# Patient Record
Sex: Male | Born: 1985
Health system: Southern US, Community
[De-identification: ages and names within clinical notes are randomized; demographics above are authoritative.]

## PROBLEM LIST (undated history)

## (undated) DIAGNOSIS — Z789 Other specified health status: Secondary | ICD-10-CM

---

## 2004-01-15 ENCOUNTER — Emergency Department (HOSPITAL_COMMUNITY): Admission: EM | Admit: 2004-01-15 | Discharge: 2004-01-15 | Payer: Self-pay | Admitting: Emergency Medicine

## 2015-06-14 ENCOUNTER — Emergency Department (HOSPITAL_COMMUNITY)
Admission: EM | Admit: 2015-06-14 | Discharge: 2015-06-14 | Disposition: A | Discharge status: Home / Self Care | Payer: Self-pay | Attending: Emergency Medicine | Admitting: Emergency Medicine

## 2015-06-14 ENCOUNTER — Encounter (HOSPITAL_COMMUNITY): Payer: Self-pay

## 2015-06-14 DIAGNOSIS — A64 Unspecified sexually transmitted disease: Secondary | ICD-10-CM | POA: Insufficient documentation

## 2015-06-14 MED ORDER — AZITHROMYCIN 250 MG PO TABS
1000.0000 mg | ORAL_TABLET | Freq: Once | ORAL | Status: AC
  Administered 2015-06-14: 1000 mg via ORAL
  Filled 2015-06-14: qty 4

## 2015-06-14 MED ORDER — ACYCLOVIR 400 MG PO TABS: 400.0000 mg | ORAL_TABLET | Freq: Three times a day (TID) | ORAL | Status: DC

## 2015-06-14 MED ORDER — CEFTRIAXONE SODIUM 250 MG IJ SOLR
250.0000 mg | Freq: Once | INTRAMUSCULAR | Status: AC
  Administered 2015-06-14: 250 mg via INTRAMUSCULAR
  Filled 2015-06-14: qty 250

## 2015-06-14 NOTE — ED Notes (Addendum)
Pt c/o rash to penis x 1 week.  Had unprotected oral sex a few weeks ago.  C/O itching.  Denies d/c. Denies pain. States he also feels like he's got a cold.

## 2015-06-14 NOTE — Discharge Instructions (Signed)
He was seen and evaluated in the ED today. You were treated empirically for herpes as well as gonorrhea and chlamydia. You'll receive a call in 3 days if your tests are positive. Please follow-up with health department for further evaluation and management of your symptoms.  Sexually Transmitted Disease A sexually transmitted disease (STD) is a disease or infection often passed to another person during sex. However, STDs can be passed through nonsexual ways. An STD can be passed through:  Spit (saliva).  Semen.  Blood.  Mucus from the vagina.  Pee (urine). HOW CAN I LESSEN MY CHANCES OF GETTING AN STD?  Use:  Latex condoms.  Water-soluble lubricants with condoms. Do not use petroleum jelly or oils.  Dental dams. These are small pieces of latex that are used as a barrier during oral sex.  Avoid having more than one sex partner.  Do not have sex with someone who has other sex partners.  Do not have sex with anyone you do not know or who is at high risk for an STD.  Avoid risky sex that can break your skin.  Do not have sex if you have open sores on your mouth or skin.  Avoid drinking too much alcohol or taking illegal drugs. Alcohol and drugs can affect your good judgment.  Avoid oral and anal sex acts.  Get shots (vaccines) for HPV and hepatitis.  If you are at risk of being infected with HIV, it is advised that you take a certain medicine daily to prevent HIV infection. This is called pre-exposure prophylaxis (PrEP). You may be at risk if:  You are a man who has sex with other men (MSM).  You are attracted to the opposite sex (heterosexual) and are having sex with more than one partner.  You take drugs with a needle.  You have sex with someone who has HIV.  Talk with your doctor about if you are at high risk of being infected with HIV. If you begin to take PrEP, get tested for HIV first. Get tested every 3 months for as long as you are taking PrEP. WHAT SHOULD I DO  IF I THINK I HAVE AN STD?  See your doctor.  Tell your sex partner(s) that you have an STD. They should be tested and treated.  Do not have sex until your doctor says it is okay. WHEN SHOULD I GET HELP? Get help right away if:  You have bad belly (abdominal) pain.  You are a man and have puffiness (swelling) or pain in your testicles.  You are a woman and have puffiness in your vagina. Document Released: 12/30/2004 Document Revised: 11/27/2013 Document Reviewed: 05/18/2013 [facility] Patient Information 2015 [place]. This information is not intended to replace advice given to you by your health care provider. Make sure you discuss any questions you have with your health care provider.

## 2015-06-14 NOTE — ED Provider Notes (Signed)
CSN: [id number]     Arrival date & time 06/14/15  0902 History   First MD Initiated Contact with Patient 06/14/15 [phone]     Chief Complaint  Patient presents with  . Rash     (Consider location/radiation/quality/duration/timing/severity/associated sxs/prior Treatment) HPI Gary Graham is a 29 y.o. male who comes in for evaluation of penile rash. Patient states 3 weeks ago he had unprotected oral sex and has noticed a rash at the end of his penis for the last week. Reports associated itching. He denies any fevers, penile discharge, urinary symptoms, scrotal pain or swelling, abdominal pain, nausea or vomiting. No interventions tried. No other aggravating or modifying factors. Patient has no other medical complaints.  History reviewed. No pertinent past medical history. History reviewed. No pertinent past surgical history. No family history on file. History  Substance Use Topics  . Smoking status: Never Smoker   . Smokeless tobacco: Not on file  . Alcohol Use: Yes     Comment: states he drinks most days a week    Review of Systems A 10 point review of systems was completed and was negative except for pertinent positives and negatives as mentioned in the history of present illness     Allergies  Review of patient's allergies indicates no known allergies.  Home Medications   Prior to Admission medications   Medication Sig Start Date End Date Taking? Authorizing Provider  acyclovir (ZOVIRAX) 400 MG tablet Take 1 tablet (400 mg total) by mouth 3 (three) times daily. 06/14/15   Joycie Peek, PA-C   BP 136/84 mmHg  Pulse 101  Temp(Src) 98.8 F (37.1 C) (Oral)  Resp 18  Ht  (1.753 m)  Wt 205 lb (92.987 kg)  BMI 30.26 kg/m2  SpO2 98% Physical Exam  Constitutional:  Awake, alert, nontoxic appearance.  HENT:  Head: Atraumatic.  Eyes: Right eye exhibits no discharge. Left eye exhibits no discharge.  Neck: Neck supple.  Pulmonary/Chest: Effort normal. He exhibits no  tenderness.  Abdominal: Soft. There is no tenderness. There is no rebound.  Genitourinary:  Chaperone present for entirety of genital exam. Nonpainful, crusting lesions noted to shaft of penis. Small vesicle on an erythematous base noted to the glans. No discharge noted See pictures attached.  Musculoskeletal: He exhibits no tenderness.  Baseline ROM, no obvious new focal weakness.  Neurological:  Mental status and motor strength appears baseline for patient and situation.  Skin: No rash noted.  Psychiatric: He has a normal mood and affect.  Nursing note and vitals reviewed.       ED Course  Procedures (including critical care time) Labs Review Labs Reviewed  HERPES SIMPLEX VIRUS CULTURE  HIV ANTIBODY (ROUTINE TESTING)  RPR  HSV 1 ANTIBODY, IGG  HSV 2 ANTIBODY, IGG  GC/CHLAMYDIA PROBE AMP ([place]) NOT AT [facility]    Imaging Review No results found.   EKG Interpretation None     Meds given in ED:  Medications  cefTRIAXone (ROCEPHIN) injection 250 mg (not administered)  azithromycin (ZITHROMAX) tablet 1,000 mg (not administered)    New Prescriptions   ACYCLOVIR (ZOVIRAX) 400 MG TABLET    Take 1 tablet (400 mg total) by mouth 3 (three) times daily.    Filed Vitals:   06/14/15 0910  BP: 136/84  Pulse: 101  Temp: 98.8 F (37.1 C)  TempSrc: Oral  Resp: 18  Height:  (1.753 m)  Weight: 205 lb (92.987 kg)  SpO2: 98%    MDM  Vitals stable -  WNL -afebrile Pt resting comfortably in ED. PE--physical exam as mentioned above. Labwork--test for HSV 1, 2, syphilis, GC chlamydia  DDX--will treat empirically with acyclovir, Rocephin and azithromycin in the ED. Encouraged safe sex practices, follow-up with health Department. I discussed all relevant lab findings and imaging results with pt and they verbalized understanding. Discussed f/u with PCP within 48 hrs and return precautions, pt very amenable to plan.  Final diagnoses:  STI (sexually transmitted  infection)       Joycie PeekBenjamin Nike Southwell, PA-C 06/14/15 [phone]  Mirian MoMatthew Gentry, MD 06/16/15 [phone]

## 2015-06-15 LAB — RPR: RPR Ser Ql: NONREACTIVE

## 2015-06-15 LAB — HIV ANTIBODY (ROUTINE TESTING W REFLEX): HIV Screen 4th Generation wRfx: NONREACTIVE

## 2015-06-16 LAB — GC/CHLAMYDIA PROBE AMP (~~LOC~~) NOT AT ARMC
CHLAMYDIA, DNA PROBE: NEGATIVE
Neisseria Gonorrhea: NEGATIVE

## 2015-06-18 LAB — HERPES SIMPLEX VIRUS CULTURE: Culture: DETECTED

## 2015-06-20 ENCOUNTER — Telehealth (HOSPITAL_COMMUNITY): Payer: Self-pay

## 2015-06-20 NOTE — ED Notes (Signed)
Positive for Herpes simplex type 1.  Treated per protocol. Attempting to notify

## 2016-06-19 ENCOUNTER — Emergency Department (HOSPITAL_COMMUNITY): Payer: Self-pay

## 2016-06-19 ENCOUNTER — Encounter (HOSPITAL_COMMUNITY): Payer: Self-pay | Admitting: *Deleted

## 2016-06-19 ENCOUNTER — Emergency Department (HOSPITAL_COMMUNITY)
Admission: EM | Admit: 2016-06-19 | Discharge: 2016-06-19 | Disposition: A | Discharge status: Home / Self Care | Payer: Self-pay | Attending: Emergency Medicine | Admitting: Emergency Medicine

## 2016-06-19 DIAGNOSIS — J189 Pneumonia, unspecified organism: Secondary | ICD-10-CM | POA: Insufficient documentation

## 2016-06-19 LAB — CBC
HEMATOCRIT: 44.5 % (ref 39.0–52.0)
HEMOGLOBIN: 15.1 g/dL (ref 13.0–17.0)
MCH: 30.6 pg (ref 26.0–34.0)
MCHC: 33.9 g/dL (ref 30.0–36.0)
MCV: 90.1 fL (ref 78.0–100.0)
Platelets: 342 10*3/uL (ref 150–400)
RBC: 4.94 MIL/uL (ref 4.22–5.81)
RDW: 12.6 % (ref 11.5–15.5)
WBC: 10.2 10*3/uL (ref 4.0–10.5)

## 2016-06-19 LAB — URINALYSIS, ROUTINE W REFLEX MICROSCOPIC
Bilirubin Urine: NEGATIVE
Glucose, UA: NEGATIVE mg/dL
Ketones, ur: NEGATIVE mg/dL
Leukocytes, UA: NEGATIVE
NITRITE: NEGATIVE
Protein, ur: NEGATIVE mg/dL
SPECIFIC GRAVITY, URINE: 1.007 (ref 1.005–1.030)
pH: 6 (ref 5.0–8.0)

## 2016-06-19 LAB — COMPREHENSIVE METABOLIC PANEL
ALK PHOS: 69 U/L (ref 38–126)
ALT: 27 U/L (ref 17–63)
ANION GAP: 10 (ref 5–15)
AST: 21 U/L (ref 15–41)
Albumin: 3.6 g/dL (ref 3.5–5.0)
BUN: 7 mg/dL (ref 6–20)
CO2: 23 mmol/L (ref 22–32)
Calcium: 9.1 mg/dL (ref 8.9–10.3)
Chloride: 104 mmol/L (ref 101–111)
Creatinine, Ser: 0.94 mg/dL (ref 0.61–1.24)
GFR calc non Af Amer: >60 mL/min (ref >60)
Glucose, Bld: 99 mg/dL (ref 65–99)
POTASSIUM: 3.3 mmol/L — AB (ref 3.5–5.1)
SODIUM: 137 mmol/L (ref 135–145)
TOTAL PROTEIN: 7 g/dL (ref 6.5–8.1)
Total Bilirubin: 0.6 mg/dL (ref 0.3–1.2)

## 2016-06-19 LAB — URINE MICROSCOPIC-ADD ON

## 2016-06-19 LAB — LIPASE, BLOOD: Lipase: 17 U/L (ref 11–51)

## 2016-06-19 MED ORDER — AZITHROMYCIN 250 MG PO TABS: 250.0000 mg | ORAL_TABLET | Freq: Every day | ORAL | Status: DC

## 2016-06-19 MED ORDER — TRAMADOL HCL 50 MG PO TABS: 50.0000 mg | ORAL_TABLET | Freq: Four times a day (QID) | ORAL | Status: DC | PRN

## 2016-06-19 MED ORDER — OXYCODONE-ACETAMINOPHEN 5-325 MG PO TABS
1.0000 | ORAL_TABLET | Freq: Once | ORAL | Status: AC
  Administered 2016-06-19: 1 via ORAL
  Filled 2016-06-19: qty 1

## 2016-06-19 NOTE — Discharge Instructions (Signed)

## 2016-06-19 NOTE — ED Notes (Signed)
The pt is c/o rt abd pain since yesterday  No n v or d

## 2016-06-19 NOTE — ED Notes (Signed)
Pt taken to xray 

## 2016-06-19 NOTE — ED Notes (Signed)
Pt returned from xray

## 2016-06-19 NOTE — ED Notes (Signed)
Pt transporting to US.

## 2016-06-19 NOTE — ED Provider Notes (Signed)
CSN: [id number]     Arrival date & time 06/19/16  0258 History  By signing my name below, I, Gary Graham, attest that this documentation has been prepared under the direction and in the presence of Gary Crease, MD. Electronically Signed: Angelene Graham, ED Scribe. 06/19/2016. 3:25 AM.    Chief Complaint  Patient presents with  . Abdominal Pain   Patient is a 30 y.o. male presenting with abdominal pain. The history is provided by the patient. No language interpreter was used.  Abdominal Pain Pain location:  RUQ Pain quality: pressure   Pain radiates to:  Back Pain severity:  Moderate Onset quality:  Sudden Timing:  Intermittent Progression:  Worsening Chronicity:  New Relieved by:  None tried Worsened by:  Nothing tried Ineffective treatments:  None tried Associated symptoms: no chills, no cough, no diarrhea, no nausea and no vomiting    HPI Comments: Gary Graham is a 30 y.o. male who presents to the Emergency Department complaining of sudden onset of gradually worsening intermittent pressure RUQ pain that radiates toward his right back onset yesterday evening. He notes that the pain is worse in his back. No alleviating factors noted. Pt has not tried any medications PTA. No sick contacts noted. He denies a hx of similar symptoms or any abdominal surgeries. He also denies any chills, n/v/d or cough.    History reviewed. No pertinent past medical history. History reviewed. No pertinent past surgical history. No family history on file. Social History  Substance Use Topics  . Smoking status: Never Smoker   . Smokeless tobacco: None  . Alcohol Use: Yes     Comment: states he drinks most days a week    Review of Systems  Constitutional: Negative for chills.  Respiratory: Negative for cough.   Gastrointestinal: Positive for abdominal pain. Negative for nausea, vomiting and diarrhea.  Musculoskeletal: Positive for back pain.  All other systems reviewed and are  negative.     Allergies  Review of patient's allergies indicates no known allergies.  Home Medications   Prior to Admission medications   Medication Sig Start Date End Date Taking? Authorizing Provider  acyclovir (ZOVIRAX) 400 MG tablet Take 1 tablet (400 mg total) by mouth 3 (three) times daily. Patient not taking: Reported on 06/19/2016 06/14/15   Gary Peek, PA-C  azithromycin (ZITHROMAX) 250 MG tablet Take 1 tablet (250 mg total) by mouth daily. Take first 2 tablets together, then 1 every day until finished. 06/19/16   Gary Crease, MD  traMADol (ULTRAM) 50 MG tablet Take 1 tablet (50 mg total) by mouth every 6 (six) hours as needed. 06/19/16   Gary Crease, MD   BP 111/67 mmHg  Pulse 88  Temp(Src) 99.5 F (37.5 C) (Oral)  Resp 18  SpO2 94% Physical Exam  Constitutional: He is oriented to person, place, and time. He appears well-developed and well-nourished. No distress.  HENT:  Head: Normocephalic and atraumatic.  Right Ear: Hearing normal.  Left Ear: Hearing normal.  Nose: Nose normal.  Mouth/Throat: Oropharynx is clear and moist and mucous membranes are normal.  Eyes: Conjunctivae and EOM are normal. Pupils are equal, round, and reactive to light.  Neck: Normal range of motion. Neck supple.  Cardiovascular: Regular rhythm, S1 normal and S2 normal.  Exam reveals no gallop and no friction rub.   No murmur heard. Pulmonary/Chest: Effort normal and breath sounds normal. No respiratory distress. He exhibits no tenderness.  Abdominal: Soft. Normal appearance and bowel sounds are  normal. There is no hepatosplenomegaly. There is tenderness. There is no rebound, no guarding, no tenderness at McBurney's point and negative Murphy's sign. No hernia.  RUQ tenderness, no murphy's sign  Musculoskeletal: Normal range of motion.  Neurological: He is alert and oriented to person, place, and time. He has normal strength. No cranial nerve deficit or sensory deficit.  Coordination normal. GCS eye subscore is 4. GCS verbal subscore is 5. GCS motor subscore is 6.  Skin: Skin is warm, dry and intact. No rash noted. No cyanosis.  Psychiatric: He has a normal mood and affect. His speech is normal and behavior is normal. Thought content normal.  Nursing note and vitals reviewed.   ED Course  Procedures (including critical care time) DIAGNOSTIC STUDIES: Oxygen Saturation is 95% on RA, normal by my interpretation.    COORDINATION OF CARE: 3:22 AM- Pt advised of plan for treatment and pt agrees. Pt will receive lab work for further evaluation.    Labs Review Labs Reviewed  COMPREHENSIVE METABOLIC PANEL - Abnormal; Notable for the following:    Potassium 3.3 (*)    All other components within normal limits  URINALYSIS, ROUTINE W REFLEX MICROSCOPIC (NOT AT [facility]) - Abnormal; Notable for the following:    Hgb urine dipstick SMALL (*)    All other components within normal limits  URINE MICROSCOPIC-ADD ON - Abnormal; Notable for the following:    Squamous Epithelial / LPF 0-5 (*)    Bacteria, UA RARE (*)    All other components within normal limits  LIPASE, BLOOD  CBC    Imaging Review Dg Chest 2 View  06/19/2016  CLINICAL DATA:  Right flank pain for 3 days. EXAM: CHEST  2 VIEW COMPARISON:  None. FINDINGS: Shallow inspiration. Increased opacity in the right lung base could represent atelectasis or pneumonia. No blunting of costophrenic angles. No pneumothorax. Normal heart size and pulmonary vascularity. Mediastinal contours appear intact. IMPRESSION: Shallow inspiration. Right lung base opacity could represent atelectasis or pneumonia. Electronically Signed   By: Gary Graham M.D.   On: 06/19/2016 06:02   US Abdomen Limited Ruq  06/19/2016  CLINICAL DATA:  Abdominal pain for 1 day. EXAM: US ABDOMEN LIMITED - RIGHT UPPER QUADRANT COMPARISON:  None. FINDINGS: Gallbladder: No gallstones or wall thickening visualized. No sonographic Murphy sign noted by  sonographer. Common bile duct: Diameter: 3 mm, normal Liver: Mildly increased parenchymal echotexture suggesting diffuse fatty infiltration. Focal sparing along the gallbladder fossa. IMPRESSION: Diffuse fatty infiltration of the liver. No evidence of cholelithiasis or cholecystitis. Electronically Signed   By: Gary Graham M.D.   On: 06/19/2016 04:16     Gary Crease, MD has personally reviewed and evaluated these images and lab results as part of his medical decision-making.   EKG Interpretation None      MDM   Final diagnoses:  CAP (community acquired pneumonia)    Patient presents to the ER for evaluation of abdominal pain. Patient experiencing pain around his right flank area. He has not had nausea, vomiting or diarrhea associated with symptoms. Pain is in the right posterior upper back area as well as right upper abdomen. He did have tenderness in the right upper quadrant, but no obvious Murphy sign. Lab work has been unremarkable. Vital signs are unremarkable. Gallbladder ultrasound negative, does have fatty liver which does not explain his current symptoms. Chest x-ray does show opacity at the right base. Will treat for community-acquired pneumonia.  I personally performed the services described in this documentation,  which was scribed in my presence. The recorded information has been reviewed and is accurate.    Gary Creasehristopher J Jamaris Biernat, MD 06/19/16 [phone]

## 2019-10-30 ENCOUNTER — Encounter (HOSPITAL_COMMUNITY): Payer: Self-pay | Admitting: Emergency Medicine

## 2019-10-30 ENCOUNTER — Emergency Department (HOSPITAL_COMMUNITY)
Admission: EM | Admit: 2019-10-30 | Discharge: 2019-10-31 | Discharge status: Against Medical Advice | Payer: Self-pay | Attending: Emergency Medicine | Admitting: Emergency Medicine

## 2019-10-30 ENCOUNTER — Other Ambulatory Visit: Payer: Self-pay

## 2019-10-30 ENCOUNTER — Emergency Department (HOSPITAL_COMMUNITY): Payer: Self-pay

## 2019-10-30 DIAGNOSIS — Z5321 Procedure and treatment not carried out due to patient leaving prior to being seen by health care provider: Secondary | ICD-10-CM | POA: Insufficient documentation

## 2019-10-30 LAB — BASIC METABOLIC PANEL
Anion gap: 15 (ref 5–15)
BUN: 12 mg/dL (ref 6–20)
CO2: 17 mmol/L — ABNORMAL LOW (ref 22–32)
Calcium: 8.8 mg/dL — ABNORMAL LOW (ref 8.9–10.3)
Chloride: 105 mmol/L (ref 98–111)
Creatinine, Ser: 1.13 mg/dL (ref 0.61–1.24)
GFR calc Af Amer: >60 mL/min (ref >60)
GFR calc non Af Amer: >60 mL/min (ref >60)
Glucose, Bld: 143 mg/dL — ABNORMAL HIGH (ref 70–99)
Potassium: 3 mmol/L — ABNORMAL LOW (ref 3.5–5.1)
Sodium: 137 mmol/L (ref 135–145)

## 2019-10-30 LAB — CBC
HCT: 45.2 % (ref 39.0–52.0)
Hemoglobin: 15.5 g/dL (ref 13.0–17.0)
MCH: 32 pg (ref 26.0–34.0)
MCHC: 34.3 g/dL (ref 30.0–36.0)
MCV: 93.2 fL (ref 80.0–100.0)
Platelets: 382 10*3/uL (ref 150–400)
RBC: 4.85 MIL/uL (ref 4.22–5.81)
RDW: 12.4 % (ref 11.5–15.5)
WBC: 9.8 10*3/uL (ref 4.0–10.5)
nRBC: 0 % (ref 0.0–0.2)

## 2019-10-30 LAB — TROPONIN I (HIGH SENSITIVITY): Troponin I (High Sensitivity): 15 ng/L (ref <18)

## 2019-10-30 MED ORDER — SODIUM CHLORIDE 0.9% FLUSH: 3.0000 mL | Freq: Once | INTRAVENOUS | Status: DC

## 2019-10-30 MED ORDER — ONDANSETRON 4 MG PO TBDP
4.0000 mg | ORAL_TABLET | Freq: Once | ORAL | Status: AC
  Administered 2019-10-30: 4 mg via ORAL
  Filled 2019-10-30: qty 1

## 2019-10-30 NOTE — ED Triage Notes (Signed)
Patient reports central chest pain with emesis this evening , no SOB , denies diaphoresis . Occasional productive cough .

## 2019-10-31 NOTE — ED Notes (Signed)
Pt did not respond when called for vitals check 

## 2019-11-03 ENCOUNTER — Observation Stay (HOSPITAL_BASED_OUTPATIENT_CLINIC_OR_DEPARTMENT_OTHER): Payer: Self-pay

## 2019-11-03 ENCOUNTER — Other Ambulatory Visit: Payer: Self-pay

## 2019-11-03 ENCOUNTER — Inpatient Hospital Stay (HOSPITAL_COMMUNITY)
Admission: EM | Admit: 2019-11-03 | Discharge: 2019-11-06 | DRG: 247 | Disposition: A | Discharge status: Home / Self Care | Payer: Self-pay | Attending: Internal Medicine | Admitting: Internal Medicine

## 2019-11-03 ENCOUNTER — Encounter (HOSPITAL_COMMUNITY): Payer: Self-pay | Admitting: Family Medicine

## 2019-11-03 ENCOUNTER — Emergency Department (HOSPITAL_COMMUNITY): Payer: Self-pay

## 2019-11-03 DIAGNOSIS — R0789 Other chest pain: Secondary | ICD-10-CM

## 2019-11-03 DIAGNOSIS — F17200 Nicotine dependence, unspecified, uncomplicated: Secondary | ICD-10-CM | POA: Diagnosis present

## 2019-11-03 DIAGNOSIS — D72829 Elevated white blood cell count, unspecified: Secondary | ICD-10-CM | POA: Diagnosis present

## 2019-11-03 DIAGNOSIS — I2510 Atherosclerotic heart disease of native coronary artery without angina pectoris: Secondary | ICD-10-CM | POA: Diagnosis present

## 2019-11-03 DIAGNOSIS — R079 Chest pain, unspecified: Secondary | ICD-10-CM

## 2019-11-03 DIAGNOSIS — Z20828 Contact with and (suspected) exposure to other viral communicable diseases: Secondary | ICD-10-CM | POA: Diagnosis present

## 2019-11-03 DIAGNOSIS — Z833 Family history of diabetes mellitus: Secondary | ICD-10-CM

## 2019-11-03 DIAGNOSIS — E785 Hyperlipidemia, unspecified: Secondary | ICD-10-CM | POA: Diagnosis present

## 2019-11-03 DIAGNOSIS — R778 Other specified abnormalities of plasma proteins: Secondary | ICD-10-CM

## 2019-11-03 DIAGNOSIS — R7989 Other specified abnormal findings of blood chemistry: Secondary | ICD-10-CM

## 2019-11-03 DIAGNOSIS — I214 Non-ST elevation (NSTEMI) myocardial infarction: Principal | ICD-10-CM | POA: Diagnosis present

## 2019-11-03 DIAGNOSIS — Z720 Tobacco use: Secondary | ICD-10-CM | POA: Diagnosis present

## 2019-11-03 DIAGNOSIS — Z955 Presence of coronary angioplasty implant and graft: Secondary | ICD-10-CM

## 2019-11-03 DIAGNOSIS — Z8249 Family history of ischemic heart disease and other diseases of the circulatory system: Secondary | ICD-10-CM

## 2019-11-03 HISTORY — DX: Other specified health status: Z78.9

## 2019-11-03 LAB — RAPID URINE DRUG SCREEN, HOSP PERFORMED
Amphetamines: NOT DETECTED
Barbiturates: NOT DETECTED
Benzodiazepines: NOT DETECTED
Cocaine: NOT DETECTED
Opiates: POSITIVE — AB
Tetrahydrocannabinol: NOT DETECTED

## 2019-11-03 LAB — CBC
HCT: 48 % (ref 39.0–52.0)
Hemoglobin: 16.4 g/dL (ref 13.0–17.0)
MCH: 31.8 pg (ref 26.0–34.0)
MCHC: 34.2 g/dL (ref 30.0–36.0)
MCV: 93 fL (ref 80.0–100.0)
Platelets: 360 10*3/uL (ref 150–400)
RBC: 5.16 MIL/uL (ref 4.22–5.81)
RDW: 11.9 % (ref 11.5–15.5)
WBC: 10.8 10*3/uL — ABNORMAL HIGH (ref 4.0–10.5)
nRBC: 0 % (ref 0.0–0.2)

## 2019-11-03 LAB — BASIC METABOLIC PANEL
Anion gap: 14 (ref 5–15)
BUN: 10 mg/dL (ref 6–20)
CO2: 25 mmol/L (ref 22–32)
Calcium: 9.9 mg/dL (ref 8.9–10.3)
Chloride: 98 mmol/L (ref 98–111)
Creatinine, Ser: 1.12 mg/dL (ref 0.61–1.24)
GFR calc Af Amer: >60 mL/min (ref >60)
GFR calc non Af Amer: >60 mL/min (ref >60)
Glucose, Bld: 109 mg/dL — ABNORMAL HIGH (ref 70–99)
Potassium: 3.9 mmol/L (ref 3.5–5.1)
Sodium: 137 mmol/L (ref 135–145)

## 2019-11-03 LAB — ECHOCARDIOGRAM COMPLETE
Height: 69 in
Weight: 3488 oz

## 2019-11-03 LAB — SARS CORONAVIRUS 2 BY RT PCR (HOSPITAL ORDER, PERFORMED IN ~~LOC~~ HOSPITAL LAB): SARS Coronavirus 2: NEGATIVE

## 2019-11-03 LAB — C-REACTIVE PROTEIN: CRP: 7.7 mg/dL — ABNORMAL HIGH (ref <1.0)

## 2019-11-03 LAB — SEDIMENTATION RATE: Sed Rate: 22 mm/hr — ABNORMAL HIGH (ref 0–16)

## 2019-11-03 LAB — TROPONIN I (HIGH SENSITIVITY)
Troponin I (High Sensitivity): 5012 ng/L (ref <18)
Troponin I (High Sensitivity): 4082 ng/L (ref <18)

## 2019-11-03 LAB — HEPARIN LEVEL (UNFRACTIONATED): Heparin Unfractionated: <0.1 IU/mL — ABNORMAL LOW (ref 0.30–0.70)

## 2019-11-03 LAB — MRSA PCR SCREENING: MRSA by PCR: NEGATIVE

## 2019-11-03 MED ORDER — ONDANSETRON HCL 4 MG/2ML IJ SOLN
4.0000 mg | Freq: Once | INTRAMUSCULAR | Status: AC
  Administered 2019-11-03: 4 mg via INTRAVENOUS
  Filled 2019-11-03: qty 2

## 2019-11-03 MED ORDER — HEPARIN (PORCINE) 25000 UT/250ML-% IV SOLN
1800.0000 [IU]/h | INTRAVENOUS | Status: DC
  Administered 2019-11-03: 1100 [IU]/h via INTRAVENOUS
  Administered 2019-11-04: 1700 [IU]/h via INTRAVENOUS
  Administered 2019-11-04: 1400 [IU]/h via INTRAVENOUS
  Administered 2019-11-05: 1700 [IU]/h via INTRAVENOUS
  Filled 2019-11-03 (×4): qty 250

## 2019-11-03 MED ORDER — HEPARIN SODIUM (PORCINE) 5000 UNIT/ML IJ SOLN: 5000.0000 [IU] | Freq: Three times a day (TID) | INTRAMUSCULAR | Status: DC

## 2019-11-03 MED ORDER — HEPARIN BOLUS VIA INFUSION
4000.0000 [IU] | Freq: Once | INTRAVENOUS | Status: AC
  Administered 2019-11-03: 4000 [IU] via INTRAVENOUS
  Filled 2019-11-03: qty 4000

## 2019-11-03 MED ORDER — ONDANSETRON HCL 4 MG/2ML IJ SOLN: 4.0000 mg | Freq: Four times a day (QID) | INTRAMUSCULAR | Status: DC | PRN

## 2019-11-03 MED ORDER — ATORVASTATIN CALCIUM 80 MG PO TABS
80.0000 mg | ORAL_TABLET | Freq: Every day | ORAL | Status: DC
  Administered 2019-11-03 – 2019-11-05 (×3): 80 mg via ORAL
  Filled 2019-11-03 (×3): qty 1

## 2019-11-03 MED ORDER — ASPIRIN EC 81 MG PO TBEC
81.0000 mg | DELAYED_RELEASE_TABLET | Freq: Every day | ORAL | Status: DC
  Administered 2019-11-04 – 2019-11-06 (×2): 81 mg via ORAL
  Filled 2019-11-03 (×3): qty 1

## 2019-11-03 MED ORDER — METOPROLOL TARTRATE 12.5 MG HALF TABLET
12.5000 mg | ORAL_TABLET | Freq: Two times a day (BID) | ORAL | Status: DC
  Administered 2019-11-03 – 2019-11-05 (×6): 12.5 mg via ORAL
  Filled 2019-11-03 (×6): qty 1

## 2019-11-03 MED ORDER — MORPHINE SULFATE (PF) 2 MG/ML IV SOLN
2.0000 mg | INTRAVENOUS | Status: DC | PRN
  Administered 2019-11-03: 2 mg via INTRAVENOUS
  Administered 2019-11-05 (×2): 4 mg via INTRAVENOUS
  Filled 2019-11-03 (×2): qty 2
  Filled 2019-11-03: qty 1

## 2019-11-03 MED ORDER — NITROGLYCERIN 0.4 MG SL SUBL: 0.4000 mg | SUBLINGUAL_TABLET | SUBLINGUAL | Status: DC | PRN

## 2019-11-03 MED ORDER — SODIUM CHLORIDE 0.9% FLUSH: 3.0000 mL | Freq: Once | INTRAVENOUS | Status: DC

## 2019-11-03 MED ORDER — ACETAMINOPHEN 325 MG PO TABS: 650.0000 mg | ORAL_TABLET | ORAL | Status: DC | PRN

## 2019-11-03 MED ORDER — MORPHINE SULFATE (PF) 4 MG/ML IV SOLN
4.0000 mg | Freq: Once | INTRAVENOUS | Status: AC
  Administered 2019-11-03: 4 mg via INTRAVENOUS
  Filled 2019-11-03: qty 1

## 2019-11-03 MED ORDER — ASPIRIN 81 MG PO CHEW
324.0000 mg | CHEWABLE_TABLET | Freq: Once | ORAL | Status: AC
  Administered 2019-11-03: 324 mg via ORAL
  Filled 2019-11-03: qty 4

## 2019-11-03 NOTE — ED Notes (Signed)
ED TO INPATIENT HANDOFF REPORT  ED Nurse Name and Phone #: [phone]  S Name/Age/Gender Gary Graham 33 y.o. male Room/Bed: 016C/016C  Code Status   Code Status: Not on file  Home/SNF/Other Home Patient oriented to: self, place, time and situation Is this baseline? Yes   Triage Complete: Triage complete  Chief Complaint CP  Triage Note Pt came in [place] c/o Chest Pain that is located in the center of his chest. Pain started around 3 days and then subsided. Came in today because the pain woke him up from his sleep.    Allergies No Known Allergies  Level of Care/Admitting Diagnosis ED Disposition    ED Disposition Condition [facility] Area: [place] [[id number]]  Level of Care: Progressive [102]  Admit to Progressive based on following criteria: CARDIOVASCULAR & THORACIC of moderate stability with acute coronary syndrome symptoms/low risk myocardial infarction/hypertensive urgency/arrhythmias/heart failure potentially compromising stability and stable post cardiovascular intervention patients.  Covid Evaluation: Asymptomatic Screening Protocol (No Symptoms)  Diagnosis: NSTEMI (non-ST elevated myocardial infarction) [facility]) [144315]  Admitting Physician: Vianne Bulls [[id number]]  Attending Physician: Vianne Bulls [[id number]]  PT Class (Do Not Modify): Observation [104]  PT Acc Code (Do Not Modify): Observation [10022]       B Medical/Surgery History Past Medical History:  Diagnosis Date  . Medical history non-contributory    History reviewed. No pertinent surgical history.   A IV Location/Drains/Wounds Patient Lines/Drains/Airways Status   Active Line/Drains/Airways    Name:   Placement date:   Placement time:   Site:   Days:   Peripheral IV 11/03/19 Right Antecubital   11/03/19    0512    Antecubital   less than 1          Intake/Output Last 24 hours No intake or output data in the 24 hours ending 11/03/19  0824  Labs/Imaging Results for orders placed or performed during the hospital encounter of 11/03/19 (from the past 48 hour(s))  Basic metabolic panel     Status: Abnormal   Collection Time: 11/03/19  3:21 AM  Result Value Ref Range   Sodium 137 135 - 145 mmol/L   Potassium 3.9 3.5 - 5.1 mmol/L   Chloride 98 98 - 111 mmol/L   CO2 25 22 - 32 mmol/L   Glucose, Bld 109 (H) 70 - 99 mg/dL   BUN 10 6 - 20 mg/dL   Creatinine, Ser 1.12 0.61 - 1.24 mg/dL   Calcium 9.9 8.9 - 10.3 mg/dL   GFR calc non Af Amer >60 >60 mL/min   GFR calc Af Amer >60 >60 mL/min   Anion gap 14 5 - 15    Comment: Performed at [facility], [place] [address] [place], [place] [address]  CBC     Status: Abnormal   Collection Time: 11/03/19  3:21 AM  Result Value Ref Range   WBC 10.8 (H) 4.0 - 10.5 K/uL   RBC 5.16 4.22 - 5.81 MIL/uL   Hemoglobin 16.4 13.0 - 17.0 g/dL   HCT 48.0 39.0 - 52.0 %   MCV 93.0 80.0 - 100.0 fL   MCH 31.8 26.0 - 34.0 pg   MCHC 34.2 30.0 - 36.0 g/dL   RDW 11.9 11.5 - 15.5 %   Platelets 360 150 - 400 K/uL   nRBC 0.0 0.0 - 0.2 %    Comment: Performed at [facility], [place] [address] [place], [place] [address]  Troponin I (  High Sensitivity)     Status: Abnormal   Collection Time: 11/03/19  3:21 AM  Result Value Ref Range   Troponin I (High Sensitivity) 5,012 (HH) <18 ng/L    Comment: CRITICAL RESULT CALLED TO, READ BACK BY AND VERIFIED WITH: RN J BRWISCK @0412  11/03/19 BY S GEZAHEGN (NOTE) Elevated high sensitivity troponin I (hsTnI) values and significant  changes across serial measurements may suggest ACS but many other  chronic and acute conditions are known to elevate hsTnI results.  Refer to the Links section for chest pain algorithms and additional  guidance. Performed at [facility], [address] [place], [place] [address]   Troponin I (High Sensitivity)     Status: Abnormal   Collection Time: 11/03/19  5:16 AM  Result Value Ref Range   Troponin I (High  Sensitivity) 4,082 (HH) <18 ng/L    Comment: CRITICAL VALUE NOTED.  VALUE IS CONSISTENT WITH PREVIOUSLY REPORTED AND CALLED VALUE. (NOTE) Elevated high sensitivity troponin I (hsTnI) values and significant  changes across serial measurements may suggest ACS but many other  chronic and acute conditions are known to elevate hsTnI results.  Refer to the Links section for chest pain algorithms and additional  guidance. Performed at [facility], [address] [place], [place] [address]   Sedimentation rate     Status: Abnormal   Collection Time: 11/03/19  5:41 AM  Result Value Ref Range   Sed Rate 22 (H) 0 - 16 mm/hr    Comment: Performed at [facility], [address] [place], [place] [address]  C-reactive protein     Status: Abnormal   Collection Time: 11/03/19  5:41 AM  Result Value Ref Range   CRP 7.7 (H) <1.0 mg/dL    Comment: Performed at [facility], [address] [place], [place] [address]  SARS Coronavirus 2 by RT PCR (hospital order, performed in [facility]) Nasopharyngeal Nasopharyngeal Swab     Status: None   Collection Time: 11/03/19  5:46 AM   Specimen: Nasopharyngeal Swab  Result Value Ref Range   SARS Coronavirus 2 NEGATIVE NEGATIVE    Comment: (NOTE) SARS-CoV-2 target nucleic acids are NOT DETECTED. The SARS-CoV-2 RNA is generally detectable in upper and lower respiratory specimens during the acute phase of infection. The lowest concentration of SARS-CoV-2 viral copies this assay can detect is 250 copies / mL. A negative result does not preclude SARS-CoV-2 infection and should not be used as the sole basis for treatment or other patient management decisions.  A negative result may occur with improper specimen collection / handling, submission of specimen other than nasopharyngeal swab, presence of viral mutation(s) within the areas targeted by this assay, and inadequate number of viral copies (<250 copies / mL). A negative  result must be combined with clinical observations, patient history, and epidemiological information. Fact Sheet for Patients:   BoilerBrush.com.cyhttps://www.fda.gov/media/136312/download Fact Sheet for Healthcare Providers: https://pope.com/https://www.fda.gov/media/136313/download This test is not yet approved or cleared  by the [place] FDA and has been authorized for detection and/or diagnosis of SARS-CoV-2 by FDA under an Emergency Use Authorization (EUA).  This EUA will remain in effect (meaning this test can be used) for the duration of the COVID-19 declaration under Section 564(b)(1) of the Act, 21 U.S.C. section 360bbb-3(b)(1), unless the authorization is terminated or revoked sooner. Performed at [facility], [address] [place], [place] [address]    Dg Chest 2 View  Result Date:  11/03/2019 CLINICAL DATA:  Central chest pain EXAM: CHEST - 2 VIEW COMPARISON:  10/30/2019 FINDINGS: The heart size and mediastinal contours are within normal limits. Both lungs are clear. The visualized skeletal structures are unremarkable. IMPRESSION: No active cardiopulmonary disease. Electronically Signed   By: Charlett Nose M.D.   On: 11/03/2019 03:45    Pending Labs [employer] (From admission, onward)    Start     Ordered   Signed and Held  HIV Antibody (routine testing w rflx)  (HIV Antibody (Routine testing w reflex) panel)  Tomorrow morning,   R     Signed and Held   Signed and Held  Lipid panel  Tomorrow morning,   R     Signed and Held   Signed and Held  Basic metabolic panel  Tomorrow morning,   R     Signed and Held   Signed and Held  CBC  Tomorrow morning,   R     Signed and Held   Signed and Held  Hemoglobin A1c  Tomorrow morning,   R     Signed and Held          Vitals/Pain Today's Vitals   11/03/19 0515 11/03/19 0703 11/03/19 0801 11/03/19 0808  BP: 140/78  (!) 143/86 (!) 143/86  Pulse: 98  82 92  Resp: (!) 25  19 18   Temp:      TempSrc:      SpO2: 97%  98% 97%  Weight:       Height:      PainSc:  0-No pain  0-No pain    Isolation Precautions No active isolations  Medications Medications  sodium chloride flush (NS) 0.9 % injection 3 mL (3 mLs Intravenous Not Given 11/03/19 0511)  aspirin chewable tablet 324 mg (324 mg Oral Given 11/03/19 0539)  morphine 4 MG/ML injection 4 mg (4 mg Intravenous Given 11/03/19 0549)  ondansetron (ZOFRAN) injection 4 mg (4 mg Intravenous Given 11/03/19 0549)    Mobility walks Low fall risk   Focused Assessments    R Recommendations: See Admitting Provider Note  Report given to:   Additional Notes:

## 2019-11-03 NOTE — Progress Notes (Signed)
ANTICOAGULATION CONSULT NOTE - Initial Consult  Pharmacy Consult for heparin Indication: chest pain/ACS  No Known Allergies  Patient Measurements: Height: 5\' 9"  (175.3 cm) Weight: 218 lb (98.9 kg) IBW/kg (Calculated) : 70.7 Heparin Dosing Weight: 91kg  Vital Signs: Temp: 98.1 F (36.7 C) (11/28 0906) Temp Source: Oral (11/28 0906) BP: 126/85 (11/28 0906) Pulse Rate: 89 (11/28 0906)  Labs: Recent Labs    11/03/19 0321 11/03/19 0516  HGB 16.4  --   HCT 48.0  --   PLT 360  --   CREATININE 1.12  --   TROPONINIHS 5,012* 4,082*    Estimated Creatinine Clearance: 107.8 mL/min (by C-G formula based on SCr of 1.12 mg/dL).   Medical History: Past Medical History:  Diagnosis Date  . Medical history non-contributory      Assessment: 63 yoM with no cardiac hx admitted with CP and elevated troponins. Pharmacy to start IV heparin. No anticoagulation at home, none received here, CBC wnl.  Goal of Therapy:  Heparin level 0.3-0.7 units/ml Monitor platelets by anticoagulation protocol: Yes   Plan:  -Heparin 4000 units x1 then 1100 units/h -Check 6hr heparin level -Daily heparin level and CBC   Gary Graham, PharmD, BCPS Clinical Pharmacist [phone] Please check AMION for all [place] numbers 11/03/2019

## 2019-11-03 NOTE — H&P (Addendum)
History and Physical    Carder Yin Macdougal JSE:[id number] DOB: 12/01/1986 DOA: 11/03/2019  PCP: Patient, No Pcp Per   Patient coming from: Home   Chief Complaint: Chest pain   HPI: Trindon Dorton Shiveley is a 33 y.o. male who denies any significant past medical history and now presents to the emergency department for evaluation of chest pain.  Patient developed central chest pain proximately 4 days ago, was at rest at the time, had associated nausea, was seen in the emergency department where high-sensitivity troponin was normal and he ended up leaving before being evaluated by a physician due to long wait time.  Pain subsided, but has returned several times, seems to be worse with certain positions but not necessarily with exertion.  He denies any nausea currently, has not had any shortness of breath associated with this, and denies diaphoresis.  He denies any recent fevers, chills, rhinorrhea, sore throat, or cough.  ED Course: Upon arrival to the ED, patient is found to be afebrile, saturating well on room air, slightly tachypneic, and with stable blood pressure.  EKG features a sinus rhythm with nonspecific T wave abnormality.  Chest x-ray is negative for acute cardiopulmonary disease.  Chemistry panel is unremarkable.  CBC notable for mild leukocytosis.  Troponin is elevated 5012.  Patient was given 324 mg of aspirin and 4 mg IV morphine in the ED.  COVID-19 screening test is pending.  CRP, ESR, and repeat troponin are pending.  Cardiology was consulted by the ED physician and recommended medical admission.  Review of Systems:  All other systems reviewed and apart from HPI, are negative.  Past Medical History:  Diagnosis Date  . Medical history non-contributory     History reviewed. No pertinent surgical history.   reports that he has never smoked. He has never used smokeless tobacco. He reports current alcohol use. He reports that he does not use drugs.  No Known Allergies  Family History   Problem Relation Age of Onset  . Diabetes Father   . Hypertension Maternal Grandmother      Prior to Admission medications   Medication Sig Start Date End Date Taking? Authorizing Provider  acyclovir (ZOVIRAX) 400 MG tablet Take 1 tablet (400 mg total) by mouth 3 (three) times daily. Patient not taking: Reported on 06/19/2016 06/14/15   Comer Locket, PA-C  azithromycin (ZITHROMAX) 250 MG tablet Take 1 tablet (250 mg total) by mouth daily. Take first 2 tablets together, then 1 every day until finished. 06/19/16   Orpah Greek, MD  traMADol (ULTRAM) 50 MG tablet Take 1 tablet (50 mg total) by mouth every 6 (six) hours as needed. 06/19/16   Orpah Greek, MD    Physical Exam: Vitals:   11/03/19 0310 11/03/19 0319 11/03/19 0515  BP: (!) 157/100  140/78  Pulse: 99  98  Resp: (!) 22  (!) 25  Temp: 98.3 F (36.8 C)    TempSrc: Oral    SpO2: 97%  97%  Weight:  98.9 kg   Height:  5' 9"  (1.753 m)     Constitutional: NAD, calm  Eyes: PERTLA, lids and conjunctivae normal ENMT: Mucous membranes are moist. Posterior pharynx clear of any exudate or lesions.   Neck: normal, supple, no masses, no thyromegaly Respiratory: no wheezing, no crackles. Normal respiratory effort. No accessory muscle use.  Cardiovascular: S1 & S2 heard, regular rate and rhythm. No extremity edema.   Abdomen: No distension, no tenderness, sopft. Bowel sounds normal.  Musculoskeletal: no clubbing /  cyanosis. No joint deformity upper and lower extremities.  Skin: no significant rashes, lesions, ulcers. Warm, dry, well-perfused. Neurologic: No facial asymmetry. Sensation intact. Moving all extremities.  Psychiatric: Alert and oriented to person, place, and situation. Pleasant, cooperative.     Labs on Admission: I have personally reviewed following labs and imaging studies  CBC: Recent Labs  Lab 10/30/19 2157 11/03/19 0321  WBC 9.8 10.8*  HGB 15.5 16.4  HCT 45.2 48.0  MCV 93.2 93.0  PLT 382  244   Basic Metabolic Panel: Recent Labs  Lab 10/30/19 2157 11/03/19 0321  NA 137 137  K 3.0* 3.9  CL 105 98  CO2 17* 25  GLUCOSE 143* 109*  BUN 12 10  CREATININE 1.13 1.12  CALCIUM 8.8* 9.9   GFR: Estimated Creatinine Clearance: 107.8 mL/min (by C-G formula based on SCr of 1.12 mg/dL). Liver Function Tests: No results for input(s): AST, ALT, ALKPHOS, BILITOT, PROT, ALBUMIN in the last 168 hours. No results for input(s): LIPASE, AMYLASE in the last 168 hours. No results for input(s): AMMONIA in the last 168 hours. Coagulation Profile: No results for input(s): INR, PROTIME in the last 168 hours. Cardiac Enzymes: No results for input(s): CKTOTAL, CKMB, CKMBINDEX, TROPONINI in the last 168 hours. BNP (last 3 results) No results for input(s): PROBNP in the last 8760 hours. HbA1C: No results for input(s): HGBA1C in the last 72 hours. CBG: No results for input(s): GLUCAP in the last 168 hours. Lipid Profile: No results for input(s): CHOL, HDL, LDLCALC, TRIG, CHOLHDL, LDLDIRECT in the last 72 hours. Thyroid Function Tests: No results for input(s): TSH, T4TOTAL, FREET4, T3FREE, THYROIDAB in the last 72 hours. Anemia Panel: No results for input(s): VITAMINB12, FOLATE, FERRITIN, TIBC, IRON, RETICCTPCT in the last 72 hours. Urine analysis:    Component Value Date/Time   COLORURINE YELLOW 06/19/2016 0304   APPEARANCEUR CLEAR 06/19/2016 0304   LABSPEC 1.007 06/19/2016 0304   PHURINE 6.0 06/19/2016 0304   GLUCOSEU NEGATIVE 06/19/2016 0304   HGBUR SMALL (A) 06/19/2016 0304   BILIRUBINUR NEGATIVE 06/19/2016 0304   KETONESUR NEGATIVE 06/19/2016 0304   PROTEINUR NEGATIVE 06/19/2016 0304   NITRITE NEGATIVE 06/19/2016 0304   LEUKOCYTESUR NEGATIVE 06/19/2016 0304   Sepsis Labs: @LABRCNTIP (procalcitonin:4,lacticidven:4) )No results found for this or any previous visit (from the past 240 hour(s)).   Radiological Exams on Admission: Dg Chest 2 View  Result Date: 11/03/2019  CLINICAL DATA:  Central chest pain EXAM: CHEST - 2 VIEW COMPARISON:  10/30/2019 FINDINGS: The heart size and mediastinal contours are within normal limits. Both lungs are clear. The visualized skeletal structures are unremarkable. IMPRESSION: No active cardiopulmonary disease. Electronically Signed   By: Rolm Baptise M.D.   On: 11/03/2019 03:45    EKG: Independently reviewed. Sinus rhythm, non-specific T-wave abnormality.   Assessment/Plan   1. NSTEMI  - Presents with chest pain, found to have non-specific T-wave abnormality on EKG, unremarkable CXR, and HS troponin of 5012 - No SOB, cough, or leg swelling/tenderness to suggest PE  - No current or recent viral sxs  - Denies cocaine of amphetamine use  - DDx includes myocarditis, vasospasm, SCAD, ACS   - Cardiology was consulted by ED physician and recommended not anticoagulating or starting antiinflammatory medications pending repeat troponin  - Patient was treated with ASA 324 in ED  - Check delta troponin, try NTG, follow-up ESR and CRP, check echocardiogram, continue cardiac monitoring, trend EKGs, continue pain-control    PPE: Mask, face shield  DVT prophylaxis: sq heparin  Code Status: Full  Family Communication: Discussed with patient  Consults called: Cardiology consulted by ED physician  Admission status: Observation     Vianne Bulls, MD [facility] Pager [phone]  If 7PM-7AM, please contact night-coverage [email or web address] Password [facility]  11/03/2019, 6:16 AM

## 2019-11-03 NOTE — Plan of Care (Signed)

## 2019-11-03 NOTE — Progress Notes (Signed)
Briefly seen and examined this morning. Please see admission H&P by Dr. Myna Hidalgo for details. 33 year old African-American male presented with chest pain for last 4 days. EKG without any changes but troponin elevated to over 5000. Trending down.  Echocardiogram awaited. Cardiology consultation appreciated. Myocarditis versus acute ischemic heart disease. Myocarditis favored more. Urine drug screen positive for opiates only probably given in ED. May need cardiac cath/cardiac MRI. On aspirin, heparin, metoprolol and statin

## 2019-11-03 NOTE — Progress Notes (Signed)
  Echocardiogram 2D Echocardiogram has been performed.  Gary Graham M 11/03/2019, 11:06 AM

## 2019-11-03 NOTE — Progress Notes (Signed)
ANTICOAGULATION CONSULT NOTE   Pharmacy Consult for heparin Indication: chest pain/ACS  No Known Allergies  Patient Measurements: Height: 5\' 9"  (175.3 cm) Weight: 218 lb (98.9 kg) IBW/kg (Calculated) : 70.7 Heparin Dosing Weight: 91kg  Vital Signs: Temp: 98.6 F (37 C) (11/28 1804) Temp Source: Oral (11/28 1804) BP: 112/65 (11/28 1804) Pulse Rate: 85 (11/28 1804)  Labs: Recent Labs    11/03/19 0321 11/03/19 0516 11/03/19 1815  HGB 16.4  --   --   HCT 48.0  --   --   PLT 360  --   --   HEPARINUNFRC  --   --  <0.10*  CREATININE 1.12  --   --   TROPONINIHS 5,012* 4,082*  --     Estimated Creatinine Clearance: 107.8 mL/min (by C-G formula based on SCr of 1.12 mg/dL).   Medical History: Past Medical History:  Diagnosis Date  . Medical history non-contributory    Assessment: 38 yoM with no cardiac hx admitted with CP and elevated troponins. Pharmacy to start IV heparin. No anticoagulation at home, none received here, CBC wnl.  Initial heparin level came back undetectable on 1100 units/hr. Hgb 16.4, plt 360. No s/sx of bleeding or infusion issues. Trop [id number].   Goal of Therapy:  Heparin level 0.3-0.7 units/ml Monitor platelets by anticoagulation protocol: Yes   Plan:  -Increase heparin infusion to 1400 units/hr -Check 6hr heparin level -Daily heparin level and CBC  Antonietta Jewel, PharmD, BCCCP Clinical Pharmacist  Phone: [phone]  Please check AMION for all [place] phone numbers After 10:00 PM, call [place] [phone] 11/03/2019

## 2019-11-03 NOTE — ED Triage Notes (Signed)
Pt came in [place] c/o Chest Pain that is located in the center of his chest. Pain started around 3 days and then subsided. Came in today because the pain woke him up from his sleep.

## 2019-11-03 NOTE — ED Provider Notes (Signed)
[facility] EMERGENCY DEPARTMENT Provider Note   CSN: [id number] Arrival date & time: 11/03/19  0307     History   Chief Complaint Chief Complaint  Patient presents with  . Chest Pain    HPI Gary Graham is a 33 y.o. male.     HPI  This is a 33 year old male with a history of smoking who presents with chest pain.  Patient reports several day history of chest pain.  He describes it as pressure-like and at times sharp.  It comes and goes.  It is worse with laying flat.  Currently his pain is 6 out of 10.  He denies shortness of breath.  He denies any recent cough, fever, upper respiratory symptoms.  No known sick contacts or Covid exposures.  Denies history of hypertension hyperlipidemia, early family history of heart disease.  Denies any recent history of leg swelling, blood clots, or prolonged travel.  Patient reports that he felt like it might have been his reflux but it was not getting any better.  He did present on Tuesday but left without being seen.  He states "at that time my pain was really bad."  Denies exertional symptoms.  Past Medical History:  Diagnosis Date  . Medical history non-contributory     Patient Active Problem List   Diagnosis Date Noted  . NSTEMI (non-ST elevated myocardial infarction) ([place]) 11/03/2019    History reviewed. No pertinent surgical history.      Home Medications    Prior to Admission medications   Medication Sig Start Date End Date Taking? Authorizing Provider  acyclovir (ZOVIRAX) 400 MG tablet Take 1 tablet (400 mg total) by mouth 3 (three) times daily. Patient not taking: Reported on 06/19/2016 06/14/15   Comer Locket, PA-C  azithromycin (ZITHROMAX) 250 MG tablet Take 1 tablet (250 mg total) by mouth daily. Take first 2 tablets together, then 1 every day until finished. 06/19/16   Orpah Greek, MD  traMADol (ULTRAM) 50 MG tablet Take 1 tablet (50 mg total) by mouth every 6 (six) hours as needed.  06/19/16   Orpah Greek, MD    Family History Family History  Problem Relation Age of Onset  . Diabetes Father   . Hypertension Maternal Grandmother     Social History Social History   Tobacco Use  . Smoking status: Never Smoker  . Smokeless tobacco: Never Used  Substance Use Topics  . Alcohol use: Yes    Comment: states he drinks most days a week  . Drug use: No     Allergies   Patient has no known allergies.   Review of Systems Review of Systems  Constitutional: Negative for fever.  HENT: Negative for congestion.   Respiratory: Negative for shortness of breath.   Cardiovascular: Positive for chest pain. Negative for leg swelling.  Gastrointestinal: Positive for nausea. Negative for abdominal pain and vomiting.  Genitourinary: Negative for dysuria.  All other systems reviewed and are negative.    Physical Exam Updated Vital Signs BP 140/78   Pulse 98   Temp 98.3 F (36.8 C) (Oral)   Resp (!) 25   Ht 1.753 m (5\' 9" )   Wt 98.9 kg   SpO2 97%   BMI 32.19 kg/m   Physical Exam Vitals signs and nursing note reviewed.  Constitutional:      Appearance: He is well-developed.  HENT:     Head: Normocephalic and atraumatic.  Eyes:     Pupils: Pupils are equal, round,  and reactive to light.  Neck:     Musculoskeletal: Neck supple.  Cardiovascular:     Rate and Rhythm: Normal rate and regular rhythm.     Heart sounds: Normal heart sounds. No murmur. No friction rub.  Pulmonary:     Effort: Pulmonary effort is normal. No respiratory distress.     Breath sounds: Normal breath sounds. No wheezing.  Abdominal:     General: Bowel sounds are normal.     Palpations: Abdomen is soft.     Tenderness: There is no abdominal tenderness. There is no rebound.  Musculoskeletal:     Right lower leg: He exhibits no tenderness. No edema.     Left lower leg: He exhibits no tenderness. No edema.  Lymphadenopathy:     Cervical: No cervical adenopathy.  Skin:     General: Skin is warm and dry.  Neurological:     Mental Status: He is alert and oriented to person, place, and time.  Psychiatric:        Mood and Affect: Mood normal.      ED Treatments / Results  Labs (all labs ordered are listed, but only abnormal results are displayed) Labs Reviewed  BASIC METABOLIC PANEL - Abnormal; Notable for the following components:      Result Value   Glucose, Bld 109 (*)    All other components within normal limits  CBC - Abnormal; Notable for the following components:   WBC 10.8 (*)    All other components within normal limits  C-REACTIVE PROTEIN - Abnormal; Notable for the following components:   CRP 7.7 (*)    All other components within normal limits  TROPONIN I (HIGH SENSITIVITY) - Abnormal; Notable for the following components:   Troponin I (High Sensitivity) 5,012 (*)    All other components within normal limits  TROPONIN I (HIGH SENSITIVITY) - Abnormal; Notable for the following components:   Troponin I (High Sensitivity) 4,082 (*)    All other components within normal limits  SARS CORONAVIRUS 2 BY RT PCR (HOSPITAL ORDER, PERFORMED IN [facility])  SEDIMENTATION RATE    EKG EKG Interpretation  Date/Time:  Saturday November 03 2019 03:27:56 EST Ventricular Rate:  98 PR Interval:  122 QRS Duration: 88 QT Interval:  344 QTC Calculation: 439 R Axis:     Text Interpretation: Normal sinus rhythm Nonspecific T wave abnormality Confirmed by Ross Marcus ([id number]) on 11/03/2019 4:59:49 AM   Radiology Dg Chest 2 View  Result Date: 11/03/2019 CLINICAL DATA:  Central chest pain EXAM: CHEST - 2 VIEW COMPARISON:  10/30/2019 FINDINGS: The heart size and mediastinal contours are within normal limits. Both lungs are clear. The visualized skeletal structures are unremarkable. IMPRESSION: No active cardiopulmonary disease. Electronically Signed   By: Charlett Nose M.D.   On: 11/03/2019 03:45    Procedures Procedures (including  critical care time)  CRITICAL CARE Performed by: Shon Baton   Total critical care time: 35 minutes  Critical care time was exclusive of separately billable procedures and treating other patients.  Critical care was necessary to treat or prevent imminent or life-threatening deterioration.  Critical care was time spent personally by me on the following activities: development of treatment plan with patient and/or surrogate as well as nursing, discussions with consultants, evaluation of patient's response to treatment, examination of patient, obtaining history from patient or surrogate, ordering and performing treatments and interventions, ordering and review of laboratory studies, ordering and review of radiographic studies, pulse oximetry and  re-evaluation of patient's condition.   Medications Ordered in ED Medications  sodium chloride flush (NS) 0.9 % injection 3 mL (3 mLs Intravenous Not Given 11/03/19 0511)  aspirin chewable tablet 324 mg (324 mg Oral Given 11/03/19 0539)  morphine 4 MG/ML injection 4 mg (4 mg Intravenous Given 11/03/19 0549)  ondansetron (ZOFRAN) injection 4 mg (4 mg Intravenous Given 11/03/19 0549)     Initial Impression / Assessment and Plan / ED Course  I have reviewed the triage vital signs and the nursing notes.  Pertinent labs & imaging results that were available during my care of the patient were reviewed by me and considered in my medical decision making (see chart for details).  Clinical Course as of Nov 02 649  Sat Nov 03, 2019  [id number] With cardiology, Dr. Charna Busmanruby.  Recommend sed rate and CRP.  Patient will need admission and an echocardiogram.  Will trend troponins.  Requesting rapid Covid testing in case troponins trend upward and he requires catheterization.   [CH]    Clinical Course User Index [CH] Jozsef Wescoat, Mayer Maskerourtney F, MD       Patient presents with chest pain.  Ongoing for several days.  He is overall nontoxic and vital signs are  reassuring.  EKG does not show any ischemic changes.  He does have some nonspecific T wave changes.  No arrhythmia noted.  Troponin from 4 days ago was negative.  Troponin today is greater than 5000.  He is having ongoing pain.  I discussed with cardiology.  Given that he is fairly low risk for ischemia, would favor inflammatory process such as pericarditis or myocarditis.  He is clinically stable and otherwise well-appearing.  I did send a Covid test as this could certainly be a cause although patient is otherwise asymptomatic.  Will hold off on heparin until repeat troponin results.  Cardiology will evaluate.  They request hospitalist admission.  6:50 AM Repeat troponin is downtrending.  We will hold off on heparinization until formal cardiology consult.   Final Clinical Impressions(s) / ED Diagnoses   Final diagnoses:  Atypical chest pain  Elevated troponin    ED Discharge Orders    None       Shon BatonHorton, Paralee Pendergrass F, MD 11/03/19 [phone] [id number]

## 2019-11-03 NOTE — Consult Note (Addendum)
Cardiology Consultation:   Patient ID: RUDRANSH BELLANCA MRN: [id number]; DOB: 01-03-1986  Admit date: 11/03/2019 Date of Consult: 11/03/2019  Primary Care Provider: Patient, No Pcp Per Primary Cardiologist: No primary care provider on file.  Primary Electrophysiologist:  None    Patient Profile / HPI:   Musa Rewerts Kamau is a 33 y.o. male active smoker with no significant PMH who presents with chest pain. Patient was in his usual state of health until Tuesday when, while driving in his car, he noted the sudden onset of 10/10 SSCP. Was not diaphoretic. Did feel some nausea but no emesis. Pain did not seen positional, pleuritic, or exertional in nature. The patient ended up presenting to the [facility] for evaluation and had triage EKG and hsTn that were normal. He left before evaluation due to long wait times.   Over the next two days, he felt that the pain improved but did not completely resolve. This evening, the pain woke him from sleep which prompted him to represent for evaluation. Tonight, EKG changed (ST with no ischemic changes) but hsTn 5012. VS With borderline HTN (140/80s) and HR 100. Cardiology consulted for evaluation.   On ROS, he denies fever, chills, recent URI symptoms. Has not felt palpitations or pre-syncope. No LE edema, orthopnea, or PND. Has no significant family history of CAD/CHF. Of note, was previously very athletic (played football, basketball) never with any problems. Does report heavy etoh use (1 pint+ per weekday night and more on weekends). No Etoh WD previously. Last drink 3 days ago when pain started.    Past Medical History:  Diagnosis Date  . Medical history non-contributory     History reviewed. No pertinent surgical history.   Home Medications:  Prior to Admission medications   Medication Sig Start Date End Date Taking? Authorizing Provider  acyclovir (ZOVIRAX) 400 MG tablet Take 1 tablet (400 mg total) by mouth 3 (three) times daily. Patient not taking:  Reported on 06/19/2016 06/14/15   Comer Locket, PA-C  azithromycin (ZITHROMAX) 250 MG tablet Take 1 tablet (250 mg total) by mouth daily. Take first 2 tablets together, then 1 every day until finished. 06/19/16   Orpah Greek, MD  traMADol (ULTRAM) 50 MG tablet Take 1 tablet (50 mg total) by mouth every 6 (six) hours as needed. 06/19/16   Orpah Greek, MD    Inpatient Medications: Scheduled Meds: . sodium chloride flush  3 mL Intravenous Once   Continuous Infusions:  PRN Meds:   Allergies:   No Known Allergies  Social History:   Social History   Socioeconomic History  . Marital status: Single    Spouse name: Not on file  . Number of children: Not on file  . Years of education: Not on file  . Highest education level: Not on file  Occupational History  . Not on file  Social Needs  . Financial resource strain: Not on file  . Food insecurity    Worry: Not on file    Inability: Not on file  . Transportation needs    Medical: Not on file    Non-medical: Not on file  Tobacco Use  . Smoking status: Never Smoker  . Smokeless tobacco: Never Used  Substance and Sexual Activity  . Alcohol use: Yes    Comment: states he drinks most days a week  . Drug use: No  . Sexual activity: Yes  Lifestyle  . Physical activity    Days per week: Not on file  Minutes per session: Not on file  . Stress: Not on file  Relationships  . Social Herbalist on phone: Not on file    Gets together: Not on file    Attends religious service: Not on file    Active member of club or organization: Not on file    Attends meetings of clubs or organizations: Not on file    Relationship status: Not on file  . Intimate partner violence    Fear of current or ex partner: Not on file    Emotionally abused: Not on file    Physically abused: Not on file    Forced sexual activity: Not on file  Other Topics Concern  . Not on file  Social History Narrative  . Not on file     Family History:    Family History  Problem Relation Age of Onset  . Diabetes Father   . Hypertension Maternal Grandmother      Review of Systems: [y] = yes, [ ]  = no     General: Weight gain [ ] ; Weight loss [ ] ; Anorexia [ ] ; Fatigue [ ] ; Fever [ ] ; Chills [ ] ; Weakness [ ]    Cardiac: Chest pain/pressure Blue.Reese ]; Resting SOB [ ] ; Exertional SOB [ ] ; Orthopnea [ ] ; Pedal Edema [ ] ; Palpitations [ ] ; Syncope [ ] ; Presyncope [ ] ; Paroxysmal nocturnal dyspnea[ ]    Pulmonary: Cough [ ] ; Wheezing[ ] ; Hemoptysis[ ] ; Sputum [ ] ; Snoring [ ]    GI: Vomiting[ ] ; Dysphagia[ ] ; Melena[ ] ; Hematochezia [ ] ; Heartburn[ ] ; Abdominal pain [ ] ; Constipation [ ] ; Diarrhea [ ] ; BRBPR [ ]    GU: Hematuria[ ] ; Dysuria [ ] ; Nocturia[ ]    Vascular: Pain in legs with walking [ ] ; Pain in feet with lying flat [ ] ; Non-healing sores [ ] ; Stroke [ ] ; TIA [ ] ; Slurred speech [ ] ;   Neuro: Headaches[ ] ; Vertigo[ ] ; Seizures[ ] ; Paresthesias[ ] ;Blurred vision [ ] ; Diplopia [ ] ; Vision changes [ ]    Ortho/Skin: Arthritis [ ] ; Joint pain [ ] ; Muscle pain [ ] ; Joint swelling [ ] ; Back Pain [ ] ; Rash [ ]    Psych: Depression[ ] ; Anxiety[ ]    Heme: Bleeding problems [ ] ; Clotting disorders [ ] ; Anemia [ ]    Endocrine: Diabetes [ ] ; Thyroid dysfunction[ ]   Physical Exam/Data:   Vitals:   11/03/19 0310 11/03/19 0319 11/03/19 0515  BP: (!) 157/100  140/78  Pulse: 99  98  Resp: (!) 22  (!) 25  Temp: 98.3 F (36.8 C)    TempSrc: Oral    SpO2: 97%  97%  Weight:  98.9 kg   Height:  5' 9"  (1.753 m)    No intake or output data in the 24 hours ending 11/03/19 0625 Filed Weights   11/03/19 0319  Weight: 98.9 kg   Body mass index is 32.19 kg/m.  General:  Well nourished, well developed, in no acute distress HEENT: normal Lymph: no adenopathy Neck: no JVD Endocrine:  No thryomegaly Vascular: No carotid bruits; FA pulses 2+ bilaterally without bruits  Cardiac:  PMI strong, not diffuse or laterally discplaced.  normal S1, S2; RRR; II/IV late systolic murmur at RUSB.  Lungs:  clear to auscultation bilaterally, no wheezing, rhonchi or rales  Abd: soft, nontender, no hepatomegaly  Ext: no edema Musculoskeletal:  No deformities, BUE and BLE strength normal and equal Skin: warm and dry  Neuro:  CNs 2-12 intact, no focal abnormalities noted Psych:  Normal affect   EKG:  The EKG was personally reviewed and demonstrates:  ST with normal axis, normal intervals. Does not meet criteria for LVH. No obvious PR depressions.   Laboratory Data:  Chemistry Recent Labs  Lab 10/30/19 2157 11/03/19 0321  NA 137 137  K 3.0* 3.9  CL 105 98  CO2 17* 25  GLUCOSE 143* 109*  BUN 12 10  CREATININE 1.13 1.12  CALCIUM 8.8* 9.9  GFRNONAA >60 >60  GFRAA >60 >60  ANIONGAP 15 14    No results for input(s): PROT, ALBUMIN, AST, ALT, ALKPHOS, BILITOT in the last 168 hours. Hematology Recent Labs  Lab 10/30/19 2157 11/03/19 0321  WBC 9.8 10.8*  RBC 4.85 5.16  HGB 15.5 16.4  HCT 45.2 48.0  MCV 93.2 93.0  MCH 32.0 31.8  MCHC 34.3 34.2  RDW 12.4 11.9  PLT 382 360   Cardiac EnzymesNo results for input(s): TROPONINI in the last 168 hours. No results for input(s): TROPIPOC in the last 168 hours.  BNPNo results for input(s): BNP, PROBNP in the last 168 hours.  DDimer No results for input(s): DDIMER in the last 168 hours.  Radiology/Studies:  Dg Chest 2 View  Result Date: 11/03/2019 CLINICAL DATA:  Central chest pain EXAM: CHEST - 2 VIEW COMPARISON:  10/30/2019 FINDINGS: The heart size and mediastinal contours are within normal limits. Both lungs are clear. The visualized skeletal structures are unremarkable. IMPRESSION: No active cardiopulmonary disease. Electronically Signed   By: Rolm Baptise M.D.   On: 11/03/2019 03:45   Dg Chest 2 View  Result Date: 10/30/2019 CLINICAL DATA:  Chest pain EXAM: CHEST - 2 VIEW COMPARISON:  Radiograph 06/19/2016 FINDINGS: No consolidation, features of edema, pneumothorax, or  effusion. Pulmonary vascularity is normally distributed. The cardiomediastinal contours are unremarkable. No acute osseous or soft tissue abnormality. IMPRESSION: No acute cardiopulmonary abnormality. Electronically Signed   By: Lovena Le M.D.   On: 10/30/2019 22:14    Assessment and Plan:   Mr. Mabee is a pleasant 33 year old gentleman who presents with 3 days of waxing and waning atypical chest pain found to have abnormal cardiac enzymes. Presentation certainly could represent myopericarditis, but CP not classic and no relevant EKG findings. Very low suspicion for ACS, but could always represent congenital abnormality or MINOCA. No signs of symptoms of HF on exam and no arrhythmias / ectopy on telemetry.   -- Please send inflammatory markers (ESR, CRP); if markedly elevated can plan to treat for myopericarditis.  -- Complete TTE this AM; if unrevealing should plan for cMRI w/ and w/out contrast.  -- Trend troponin, if uptrending would favor heparinization until additional diagnostic testing performed.  -- Agree with risk stratification vis a vis lipids, a1c. -- Please send Utox.   Cardiology will continue to follow.   For questions or updates, please contact [place] Please consult [email or web address] for contact info under   Signed, Milus Banister, MD  11/03/2019 6:25 AM

## 2019-11-03 NOTE — Progress Notes (Signed)
   Progress Note  Patient Name: Gary Graham Learn Date of Encounter: 11/03/2019  Primary Cardiologist: New  Subjective   Still with mild CP; no dyspnea  Inpatient Medications    Scheduled Meds: . [START ON 11/04/2019] aspirin EC  81 mg Oral Daily  . heparin  5,000 Units Subcutaneous Q8H  . sodium chloride flush  3 mL Intravenous Once   Continuous Infusions:  PRN Meds: acetaminophen, morphine injection, nitroGLYCERIN, ondansetron (ZOFRAN) IV   Vital Signs    Vitals:   11/03/19 0515 11/03/19 0801 11/03/19 0808 11/03/19 0906  BP: 140/78 (!) 143/86 (!) 143/86 126/85  Pulse: 98 82 92 89  Resp: (!) 25 19 18  (!) 27  Temp:    98.1 F (36.7 C)  TempSrc:    Oral  SpO2: 97% 98% 97% 96%  Weight:      Height:       No intake or output data in the 24 hours ending 11/03/19 1127 Last 3 Weights 11/03/2019 06/14/2015  Weight (lbs) 218 lb 205 lb  Weight (kg) 98.884 kg 92.987 kg      Telemetry    Sinus - Personally Reviewed  ECG    NSR, inferolateral TWI- Personally Reviewed  Physical Exam   GEN: No acute distress.   Neck: No JVD Cardiac: RRR, no murmurs, rubs, or gallops.  Respiratory: Clear to auscultation bilaterally. GI: Soft, nontender, non-distended  MS: No edema Neuro:  Nonfocal  Psych: Normal affect   Labs    High Sensitivity Troponin:   Recent Labs  Lab 10/30/19 2157 11/03/19 0321 11/03/19 0516  TROPONINIHS 15 5,012* 4,082*      Chemistry Recent Labs  Lab 10/30/19 2157 11/03/19 0321  NA 137 137  K 3.0* 3.9  CL 105 98  CO2 17* 25  GLUCOSE 143* 109*  BUN 12 10  CREATININE 1.13 1.12  CALCIUM 8.8* 9.9  GFRNONAA >60 >60  GFRAA >60 >60  ANIONGAP 15 14     Hematology Recent Labs  Lab 10/30/19 2157 11/03/19 0321  WBC 9.8 10.8*  RBC 4.85 5.16  HGB 15.5 16.4  HCT 45.2 48.0  MCV 93.2 93.0  MCH 32.0 31.8  MCHC 34.3 34.2  RDW 12.4 11.9  PLT 382 360     Radiology    Dg Chest 2 View  Result Date: 11/03/2019 CLINICAL DATA:  Central  chest pain EXAM: CHEST - 2 VIEW COMPARISON:  10/30/2019 FINDINGS: The heart size and mediastinal contours are within normal limits. Both lungs are clear. The visualized skeletal structures are unremarkable. IMPRESSION: No active cardiopulmonary disease. Electronically Signed   By: Rolm Baptise M.D.   On: 11/03/2019 03:45    Patient Profile     33 y.o. male with no prior cardiac history admitted with chest pain and elevated troponin.  Assessment & Plan    1 non-ST elevation myocardial infarction-patient presents with chest pain and troponin is elevated.  However his symptoms have been persistent for 4 days and troponin now trending down.  Question myocarditis.  We will treat with aspirin and add heparin, low-dose metoprolol and statin.  He will require cardiac catheterization.  Check urine drug screen.  Await echocardiogram.  May need cardiac MRI.  For questions or updates, please contact [place] Please consult [email or web address] for contact info under        Signed, Kirk Ruths, MD  11/03/2019, 11:27 AM

## 2019-11-04 ENCOUNTER — Other Ambulatory Visit: Payer: Self-pay

## 2019-11-04 DIAGNOSIS — I214 Non-ST elevation (NSTEMI) myocardial infarction: Principal | ICD-10-CM

## 2019-11-04 LAB — LIPID PANEL
Cholesterol: 144 mg/dL (ref 0–200)
HDL: 37 mg/dL — ABNORMAL LOW (ref >40)
LDL Cholesterol: 85 mg/dL (ref 0–99)
Total CHOL/HDL Ratio: 3.9 RATIO
Triglycerides: 111 mg/dL (ref <150)
VLDL: 22 mg/dL (ref 0–40)

## 2019-11-04 LAB — CBC
HCT: 44.1 % (ref 39.0–52.0)
Hemoglobin: 15.3 g/dL (ref 13.0–17.0)
MCH: 32.1 pg (ref 26.0–34.0)
MCHC: 34.7 g/dL (ref 30.0–36.0)
MCV: 92.6 fL (ref 80.0–100.0)
Platelets: 347 10*3/uL (ref 150–400)
RBC: 4.76 MIL/uL (ref 4.22–5.81)
RDW: 11.7 % (ref 11.5–15.5)
WBC: 8.2 10*3/uL (ref 4.0–10.5)
nRBC: 0 % (ref 0.0–0.2)

## 2019-11-04 LAB — BASIC METABOLIC PANEL
Anion gap: 12 (ref 5–15)
BUN: 20 mg/dL (ref 6–20)
CO2: 25 mmol/L (ref 22–32)
Calcium: 9.3 mg/dL (ref 8.9–10.3)
Chloride: 102 mmol/L (ref 98–111)
Creatinine, Ser: 1.11 mg/dL (ref 0.61–1.24)
GFR calc Af Amer: >60 mL/min (ref >60)
GFR calc non Af Amer: >60 mL/min (ref >60)
Glucose, Bld: 117 mg/dL — ABNORMAL HIGH (ref 70–99)
Potassium: 4.2 mmol/L (ref 3.5–5.1)
Sodium: 139 mmol/L (ref 135–145)

## 2019-11-04 LAB — HEMOGLOBIN A1C
Hgb A1c MFr Bld: 5.5 % (ref 4.8–5.6)
Mean Plasma Glucose: 111.15 mg/dL

## 2019-11-04 LAB — HIV ANTIBODY (ROUTINE TESTING W REFLEX): HIV Screen 4th Generation wRfx: NONREACTIVE

## 2019-11-04 LAB — HEPARIN LEVEL (UNFRACTIONATED)
Heparin Unfractionated: 0.18 IU/mL — ABNORMAL LOW (ref 0.30–0.70)
Heparin Unfractionated: 0.41 IU/mL (ref 0.30–0.70)

## 2019-11-04 MED ORDER — HEPARIN BOLUS VIA INFUSION
2000.0000 [IU] | Freq: Once | INTRAVENOUS | Status: AC
  Administered 2019-11-04: 2000 [IU] via INTRAVENOUS
  Filled 2019-11-04: qty 2000

## 2019-11-04 MED ORDER — SODIUM CHLORIDE 0.9% FLUSH
3.0000 mL | Freq: Two times a day (BID) | INTRAVENOUS | Status: DC
  Administered 2019-11-04 – 2019-11-06 (×4): 3 mL via INTRAVENOUS

## 2019-11-04 NOTE — Progress Notes (Signed)
Progress Note  Patient Name: Gary Graham Date of Encounter: 11/04/2019  Primary Cardiologist: New  Subjective   No CP or dyspnea  Inpatient Medications    Scheduled Meds: . aspirin EC  81 mg Oral Daily  . atorvastatin  80 mg Oral q1800  . metoprolol tartrate  12.5 mg Oral BID  . sodium chloride flush  3 mL Intravenous Once   Continuous Infusions: . heparin 1,700 Units/hr (11/04/19 0335)   PRN Meds: acetaminophen, morphine injection, nitroGLYCERIN, ondansetron (ZOFRAN) IV   Vital Signs    Vitals:   11/03/19 2150 11/03/19 2333 11/04/19 0329 11/04/19 0714  BP: 130/88 120/74 119/74 133/78  Pulse: 93 79 87   Resp:  20 (!) 23 17  Temp:  98.9 F (37.2 C) 98.3 F (36.8 C) 97.8 F (36.6 C)  TempSrc:  Oral Oral Oral  SpO2:  98%  98%  Weight:      Height:        Intake/Output Summary (Last 24 hours) at 11/04/2019 0913 Last data filed at 11/04/2019 0335 Gross per 24 hour  Intake 605.95 ml  Output -  Net 605.95 ml   Last 3 Weights 11/03/2019 06/14/2015  Weight (lbs) 218 lb 205 lb  Weight (kg) 98.884 kg 92.987 kg      Telemetry    Sinus - Personally Reviewed  ECG    NSR, inferolateral TWI- Personally Reviewed  Physical Exam   GEN: No acute distress.  WD WN Neck: No JVD, supple Cardiac: RRR, no murmur Respiratory: CTA GI: Soft, NT, ND MS: No edema Neuro:  Grossly intact Psych: Normal affect   Labs    High Sensitivity Troponin:   Recent Labs  Lab 10/30/19 2157 11/03/19 0321 11/03/19 0516  TROPONINIHS 15 5,012* 4,082*      Chemistry Recent Labs  Lab 10/30/19 2157 11/03/19 0321 11/04/19 0218  NA 137 137 139  K 3.0* 3.9 4.2  CL 105 98 102  CO2 17* 25 25  GLUCOSE 143* 109* 117*  BUN 12 10 20   CREATININE 1.13 1.12 1.11  CALCIUM 8.8* 9.9 9.3  GFRNONAA >60 >60 >60  GFRAA >60 >60 >60  ANIONGAP 15 14 12      Hematology Recent Labs  Lab 10/30/19 2157 11/03/19 0321 11/04/19 0218  WBC 9.8 10.8* 8.2  RBC 4.85 5.16 4.76  HGB 15.5  16.4 15.3  HCT 45.2 48.0 44.1  MCV 93.2 93.0 92.6  MCH 32.0 31.8 32.1  MCHC 34.3 34.2 34.7  RDW 12.4 11.9 11.7  PLT 382 360 347     Radiology    Dg Chest 2 View  Result Date: 11/03/2019 CLINICAL DATA:  Central chest pain EXAM: CHEST - 2 VIEW COMPARISON:  10/30/2019 FINDINGS: The heart size and mediastinal contours are within normal limits. Both lungs are clear. The visualized skeletal structures are unremarkable. IMPRESSION: No active cardiopulmonary disease. Electronically Signed   By: 11/05/2019 M.D.   On: 11/03/2019 03:45    Patient Profile     33 y.o. male with no prior cardiac history admitted with chest pain and elevated troponin.  Echocardiogram shows anteroseptal and anterior hypokinesis with overall preserved LV function.  Assessment & Plan    1 non-ST elevation myocardial infarction-patient presented with chest pain and troponins increased.  Note his symptoms had been persistent for 4 days prior to admission.  Echocardiogram shows wall motion abnormality.  This could certainly have been myocarditis but I feel coronary artery disease must also be excluded.  Plan cardiac catheterization  tomorrow.  The risks and benefits including myocardial infarction, CVA and death discussed and he agrees to proceed.  Continue aspirin, heparin, low-dose metoprolol and statin.    For questions or updates, please contact [place] Please consult [email or web address] for contact info under        Signed, Kirk Ruths, MD  11/04/2019, 9:13 AM

## 2019-11-04 NOTE — Progress Notes (Signed)
ANTICOAGULATION CONSULT NOTE - Follow Up Consult  Pharmacy Consult for heparin Indication: chest pain/ACS  Labs: Recent Labs    11/03/19 0321 11/03/19 0516 11/03/19 1815 11/04/19 0218  HGB 16.4  --   --  15.3  HCT 48.0  --   --  44.1  PLT 360  --   --  347  HEPARINUNFRC  --   --  <0.10* 0.18*  CREATININE 1.12  --   --  1.11  TROPONINIHS 5,012* 4,082*  --   --     Assessment: 33yo male remains subtherapeutic on heparin after rate change though closer to goal; no gtt issues or signs of bleeding per RN.  Goal of Therapy:  Heparin level 0.3-0.7 units/ml   Plan:  Will rebolus with heparin 2000 units and increase heparin gtt by 3 units/kg/hr to 1700 units/hr and check level in 6 hours.    Wynona Neat, PharmD, BCPS  11/04/2019,3:29 AM

## 2019-11-04 NOTE — Progress Notes (Signed)
PROGRESS NOTE    Gary Graham  OVZ:[id number] DOB: [birth year] DOA: 11/03/2019 PCP: Patient, No Pcp Per   Brief Narrative:  Gary Graham is a 33 y.o. male who denies any significant past medical history and now presents to the emergency department for evaluation of chest pain.  Patient developed central chest pain proximately 4 days ago, started initially rest at the time, had associated nausea, was seen in the emergency department where high-sensitivity troponin was normal and he ended up leaving before being evaluated by a physician due to long wait time.  Pain subsided, but has returned back, seems to be worse with certain positions but not necessarily with exertion. In the ED EKG features a sinus rhythm with nonspecific T wave abnormality.  Chest x-ray is negative for acute cardiopulmonary disease.  Chemistry panel is unremarkable.  CBC notable for mild leukocytosis.  Troponin is elevated 5012.  Patient was given 324 mg of aspirin and 4 mg IV morphine in the ED.  COVID-19 screening test is negative.  Cardiology was consulted by the ED physician and was admitted for non-STEMI. Echo shows some abnormality,  patient is scheduled to have a cardiac cath tomorrow.  Assessment & Plan:   Principal Problem:   NSTEMI (non-ST elevated myocardial infarction) (HCC)  1. NSTEMI  - Presents with chest pain, found to have non-specific T-wave abnormality on EKG, unremarkable CXR, and HS troponin of 5012 trending down. - No SOB, cough, or leg swelling / tenderness to suggest PE. - Denies current or recent viral sxs. - Denies cocaine of amphetamine use. - DDx includes myocarditis, vasospasm, SCAD, ACS   - Cardiology was consulted ,  Heparin gtt. - Patient was treated with ASA 324 in ED. - Echo shows Wall motion abnormality. - CAD must be excluded, plan cardiac cath tomorrow.  DVT prophylaxis: Heparin gtt Code Status: Full Family Communication: D/w patient. Disposition Plan: D/c Home if Cath normal   Consultants:   Cardiology   Procedures:  LHC scheduled tomorrow.  Antimicrobials: Anti-infectives (From admission, onward)   None      Subjective: Patient was seen and examined at bedside, he denies any chest pain and difficulty breathing.  He is aware that he is going to have a cardiac cath tomorrow.  Objective: Vitals:   11/03/19 2150 11/03/19 2333 11/04/19 0329 11/04/19 0714  BP: 130/88 120/74 119/74 133/78  Pulse: 93 79 87   Resp:  20 (!) 23 17  Temp:  98.9 F (37.2 C) 98.3 F (36.8 C) 97.8 F (36.6 C)  TempSrc:  Oral Oral Oral  SpO2:  98%  98%  Weight:      Height:        Intake/Output Summary (Last 24 hours) at 11/04/2019 1315 Last data filed at 11/04/2019 1208 Gross per 24 hour  Intake 845.95 ml  Output -  Net 845.95 ml   Filed Weights   11/03/19 0319  Weight: 98.9 kg    Examination:  General exam: Appears calm and comfortable  Respiratory system: Clear to auscultation. Respiratory effort normal. Cardiovascular system: S1 & S2 heard, RRR. No JVD, murmurs, rubs, gallops or clicks. No pedal edema. Gastrointestinal system: Abdomen is nondistended, soft and nontender. No organomegaly or masses felt. Normal bowel sounds heard. Central nervous system: Alert and oriented. No focal neurological deficits. Extremities: Symmetric 5 x 5 power. Skin: No rashes, lesions or ulcers Psychiatry: Judgement and insight appear normal. Mood & affect appropriate.     Data Reviewed: I have personally reviewed following labs  and imaging studies  CBC: Recent Labs  Lab 10/30/19 2157 11/03/19 0321 11/04/19 0218  WBC 9.8 10.8* 8.2  HGB 15.5 16.4 15.3  HCT 45.2 48.0 44.1  MCV 93.2 93.0 92.6  PLT 382 360 347   Basic Metabolic Panel: Recent Labs  Lab 10/30/19 2157 11/03/19 0321 11/04/19 0218  NA 137 137 139  K 3.0* 3.9 4.2  CL 105 98 102  CO2 17* 25 25  GLUCOSE 143* 109* 117*  BUN 12 10 20   CREATININE 1.13 1.12 1.11  CALCIUM 8.8* 9.9 9.3   GFR:  Estimated Creatinine Clearance: 108.8 mL/min (by C-G formula based on SCr of 1.11 mg/dL). Liver Function Tests: No results for input(s): AST, ALT, ALKPHOS, BILITOT, PROT, ALBUMIN in the last 168 hours. No results for input(s): LIPASE, AMYLASE in the last 168 hours. No results for input(s): AMMONIA in the last 168 hours. Coagulation Profile: No results for input(s): INR, PROTIME in the last 168 hours. Cardiac Enzymes: No results for input(s): CKTOTAL, CKMB, CKMBINDEX, TROPONINI in the last 168 hours. BNP (last 3 results) No results for input(s): PROBNP in the last 8760 hours. HbA1C: Recent Labs    11/04/19 0218  HGBA1C 5.5   CBG: No results for input(s): GLUCAP in the last 168 hours. Lipid Profile: Recent Labs    11/04/19 0218  CHOL 144  HDL 37*  LDLCALC 85  TRIG 960111  CHOLHDL 3.9   Thyroid Function Tests: No results for input(s): TSH, T4TOTAL, FREET4, T3FREE, THYROIDAB in the last 72 hours. Anemia Panel: No results for input(s): VITAMINB12, FOLATE, FERRITIN, TIBC, IRON, RETICCTPCT in the last 72 hours. Sepsis Labs: No results for input(s): PROCALCITON, LATICACIDVEN in the last 168 hours.  Recent Results (from the past 240 hour(s))  SARS Coronavirus 2 by RT PCR (hospital order, performed in [facility] hospital lab) Nasopharyngeal Nasopharyngeal Swab     Status: None   Collection Time: 11/03/19  5:46 AM   Specimen: Nasopharyngeal Swab  Result Value Ref Range Status   SARS Coronavirus 2 NEGATIVE NEGATIVE Final    Comment: (NOTE) SARS-CoV-2 target nucleic acids are NOT DETECTED. The SARS-CoV-2 RNA is generally detectable in upper and lower respiratory specimens during the acute phase of infection. The lowest concentration of SARS-CoV-2 viral copies this assay can detect is 250 copies / mL. A negative result does not preclude SARS-CoV-2 infection and should not be used as the sole basis for treatment or other patient management decisions.  A negative result may occur with  improper specimen collection / handling, submission of specimen other than nasopharyngeal swab, presence of viral mutation(s) within the areas targeted by this assay, and inadequate number of viral copies (<250 copies / mL). A negative result must be combined with clinical observations, patient history, and epidemiological information. Fact Sheet for Patients:   BoilerBrush.com.cyhttps://www.fda.gov/media/136312/download Fact Sheet for Healthcare Providers: https://pope.com/https://www.fda.gov/media/136313/download This test is not yet approved or cleared  by the [place] FDA and has been authorized for detection and/or diagnosis of SARS-CoV-2 by FDA under an Emergency Use Authorization (EUA).  This EUA will remain in effect (meaning this test can be used) for the duration of the COVID-19 declaration under Section 564(b)(1) of the Act, 21 U.S.C. section 360bbb-3(b)(1), unless the authorization is terminated or revoked sooner. Performed at [facility], [address] [place], [place] [address]   MRSA PCR Screening     Status: None   Collection Time: 11/03/19  9:53 PM   Specimen: Nasal Mucosa; Nasopharyngeal  Result Value Ref  Range Status   MRSA by PCR NEGATIVE NEGATIVE Final    Comment:        The GeneXpert MRSA Assay (FDA approved for NASAL specimens only), is one component of a comprehensive MRSA colonization surveillance program. It is not intended to diagnose MRSA infection nor to guide or monitor treatment for MRSA infections. Performed at [facility], [place] [address] [place], [place] [address]      Radiology Studies: Dg Chest 2 View  Result Date: 11/03/2019 CLINICAL DATA:  Central chest pain EXAM: CHEST - 2 VIEW COMPARISON:  10/30/2019 FINDINGS: The heart size and mediastinal contours are within normal limits. Both lungs are clear. The visualized skeletal structures are unremarkable. IMPRESSION: No active cardiopulmonary disease. Electronically Signed   By: Rolm Baptise M.D.   On:  11/03/2019 03:45   Scheduled Meds: . aspirin EC  81 mg Oral Daily  . atorvastatin  80 mg Oral q1800  . metoprolol tartrate  12.5 mg Oral BID  . sodium chloride flush  3 mL Intravenous Once  . sodium chloride flush  3 mL Intravenous Q12H   Continuous Infusions: . heparin 1,700 Units/hr (11/04/19 0335)     LOS: 0 days    Time spent: [place], MD [facility]   If 7PM-7AM, please contact night-coverage

## 2019-11-04 NOTE — Progress Notes (Signed)
ANTICOAGULATION CONSULT NOTE - [place] for heparin Indication: chest pain/ACS  No Known Allergies  Patient Measurements: Height: 5\' 9"  (175.3 cm) Weight: 218 lb (98.9 kg) IBW/kg (Calculated) : 70.7 Heparin Dosing Weight: 91kg  Vital Signs: Temp: 97.8 F (36.6 C) (11/29 0714) Temp Source: Oral (11/29 0714) BP: 133/78 (11/29 0714) Pulse Rate: 87 (11/29 0329)  Labs: Recent Labs    11/03/19 0321 11/03/19 0516 11/03/19 1815 11/04/19 0218 11/04/19 0916  HGB 16.4  --   --  15.3  --   HCT 48.0  --   --  44.1  --   PLT 360  --   --  347  --   HEPARINUNFRC  --   --  <0.10* 0.18* 0.41  CREATININE 1.12  --   --  1.11  --   TROPONINIHS 5,012* 4,082*  --   --   --     Estimated Creatinine Clearance: 108.8 mL/min (by C-G formula based on SCr of 1.11 mg/dL).   Medical History: Past Medical History:  Diagnosis Date  . Medical history non-contributory      Assessment: 35 yoM with no cardiac hx admitted with CP and elevated troponins. Pharmacy to start IV heparin. No anticoagulation at home, none received here, CBC wnl.  Heparin level now therapeutic at 0.41, ECHO with WMAs so planning cath, CBC stable.  Goal of Therapy:  Heparin level 0.3-0.7 units/ml Monitor platelets by anticoagulation protocol: Yes   Plan:  -Continue heparin 1700 units/hr -Daily heparin level and CBC   Arrie Senate, PharmD, BCPS Clinical Pharmacist [phone] Please check AMION for all [place] numbers 11/04/2019

## 2019-11-05 ENCOUNTER — Encounter (HOSPITAL_COMMUNITY)
Admission: EM | Disposition: A | Discharge status: Home / Self Care | Payer: Self-pay | Source: Home / Self Care | Attending: Family Medicine

## 2019-11-05 ENCOUNTER — Other Ambulatory Visit: Payer: Self-pay

## 2019-11-05 DIAGNOSIS — Z720 Tobacco use: Secondary | ICD-10-CM

## 2019-11-05 DIAGNOSIS — I2510 Atherosclerotic heart disease of native coronary artery without angina pectoris: Secondary | ICD-10-CM

## 2019-11-05 DIAGNOSIS — E785 Hyperlipidemia, unspecified: Secondary | ICD-10-CM

## 2019-11-05 HISTORY — PX: CORONARY STENT INTERVENTION: CATH118234

## 2019-11-05 HISTORY — PX: LEFT HEART CATH AND CORONARY ANGIOGRAPHY: CATH118249

## 2019-11-05 LAB — COMPREHENSIVE METABOLIC PANEL
ALT: 46 U/L — ABNORMAL HIGH (ref 0–44)
AST: 33 U/L (ref 15–41)
Albumin: 3.3 g/dL — ABNORMAL LOW (ref 3.5–5.0)
Alkaline Phosphatase: 62 U/L (ref 38–126)
Anion gap: 10 (ref 5–15)
BUN: 11 mg/dL (ref 6–20)
CO2: 24 mmol/L (ref 22–32)
Calcium: 9 mg/dL (ref 8.9–10.3)
Chloride: 104 mmol/L (ref 98–111)
Creatinine, Ser: 1.05 mg/dL (ref 0.61–1.24)
GFR calc Af Amer: >60 mL/min (ref >60)
GFR calc non Af Amer: >60 mL/min (ref >60)
Glucose, Bld: 108 mg/dL — ABNORMAL HIGH (ref 70–99)
Potassium: 3.9 mmol/L (ref 3.5–5.1)
Sodium: 138 mmol/L (ref 135–145)
Total Bilirubin: 0.5 mg/dL (ref 0.3–1.2)
Total Protein: 6.7 g/dL (ref 6.5–8.1)

## 2019-11-05 LAB — CBC
HCT: 43.1 % (ref 39.0–52.0)
Hemoglobin: 14.8 g/dL (ref 13.0–17.0)
MCH: 31.8 pg (ref 26.0–34.0)
MCHC: 34.3 g/dL (ref 30.0–36.0)
MCV: 92.7 fL (ref 80.0–100.0)
Platelets: 369 10*3/uL (ref 150–400)
RBC: 4.65 MIL/uL (ref 4.22–5.81)
RDW: 11.6 % (ref 11.5–15.5)
WBC: 9.5 10*3/uL (ref 4.0–10.5)
nRBC: 0 % (ref 0.0–0.2)

## 2019-11-05 LAB — HEPARIN LEVEL (UNFRACTIONATED): Heparin Unfractionated: 0.35 IU/mL (ref 0.30–0.70)

## 2019-11-05 LAB — POCT ACTIVATED CLOTTING TIME: Activated Clotting Time: 362 seconds

## 2019-11-05 SURGERY — LEFT HEART CATH AND CORONARY ANGIOGRAPHY
Anesthesia: LOCAL

## 2019-11-05 MED ORDER — HEPARIN SODIUM (PORCINE) 1000 UNIT/ML IJ SOLN
INTRAMUSCULAR | Status: AC
  Filled 2019-11-05: qty 1

## 2019-11-05 MED ORDER — SODIUM CHLORIDE 0.9 % WEIGHT BASED INFUSION
3.0000 mL/kg/h | INTRAVENOUS | Status: DC
  Administered 2019-11-05 (×2): 3 mL/kg/h via INTRAVENOUS

## 2019-11-05 MED ORDER — SODIUM CHLORIDE 0.9 % WEIGHT BASED INFUSION
1.0000 mL/kg/h | INTRAVENOUS | Status: DC
  Administered 2019-11-05 (×2): 1 mL/kg/h via INTRAVENOUS

## 2019-11-05 MED ORDER — VERAPAMIL HCL 2.5 MG/ML IV SOLN
INTRAVENOUS | Status: AC
  Filled 2019-11-05: qty 2

## 2019-11-05 MED ORDER — NITROGLYCERIN 1 MG/10 ML FOR IR/CATH LAB
INTRA_ARTERIAL | Status: DC | PRN
  Administered 2019-11-05: 200 ug via INTRACORONARY

## 2019-11-05 MED ORDER — FENTANYL CITRATE (PF) 100 MCG/2ML IJ SOLN
INTRAMUSCULAR | Status: AC
  Filled 2019-11-05: qty 2

## 2019-11-05 MED ORDER — FENTANYL CITRATE (PF) 100 MCG/2ML IJ SOLN
INTRAMUSCULAR | Status: DC | PRN
  Administered 2019-11-05: 25 ug via INTRAVENOUS
  Administered 2019-11-05: 50 ug via INTRAVENOUS

## 2019-11-05 MED ORDER — SODIUM CHLORIDE 0.9 % WEIGHT BASED INFUSION: 100.0000 mL/h | INTRAVENOUS | Status: AC

## 2019-11-05 MED ORDER — HEPARIN (PORCINE) IN NACL 1000-0.9 UT/500ML-% IV SOLN
INTRAVENOUS | Status: AC
  Filled 2019-11-05: qty 1000

## 2019-11-05 MED ORDER — ASPIRIN 81 MG PO CHEW
CHEWABLE_TABLET | ORAL | Status: AC
  Administered 2019-11-05: 81 mg via ORAL
  Filled 2019-11-05: qty 1

## 2019-11-05 MED ORDER — SODIUM CHLORIDE 0.9 % WEIGHT BASED INFUSION: 3.0000 mL/kg/h | INTRAVENOUS | Status: DC

## 2019-11-05 MED ORDER — MIDAZOLAM HCL 2 MG/2ML IJ SOLN
INTRAMUSCULAR | Status: AC
  Filled 2019-11-05: qty 2

## 2019-11-05 MED ORDER — TICAGRELOR 90 MG PO TABS
ORAL_TABLET | ORAL | Status: DC | PRN
  Administered 2019-11-05: 180 mg via ORAL

## 2019-11-05 MED ORDER — SODIUM CHLORIDE 0.9 % IV SOLN: 250.0000 mL | INTRAVENOUS | Status: DC | PRN

## 2019-11-05 MED ORDER — SODIUM CHLORIDE 0.9% FLUSH: 3.0000 mL | INTRAVENOUS | Status: DC | PRN

## 2019-11-05 MED ORDER — SODIUM CHLORIDE 0.9 % WEIGHT BASED INFUSION: 1.0000 mL/kg/h | INTRAVENOUS | Status: DC

## 2019-11-05 MED ORDER — HEPARIN SODIUM (PORCINE) 1000 UNIT/ML IJ SOLN
INTRAMUSCULAR | Status: DC | PRN
  Administered 2019-11-05 (×2): 5000 [IU] via INTRAVENOUS

## 2019-11-05 MED ORDER — HEPARIN (PORCINE) IN NACL 1000-0.9 UT/500ML-% IV SOLN
INTRAVENOUS | Status: DC | PRN
  Administered 2019-11-05 (×2): 500 mL

## 2019-11-05 MED ORDER — IOHEXOL 350 MG/ML SOLN
INTRAVENOUS | Status: DC | PRN
  Administered 2019-11-05: 145 mL

## 2019-11-05 MED ORDER — MIDAZOLAM HCL 2 MG/2ML IJ SOLN
INTRAMUSCULAR | Status: DC | PRN
  Administered 2019-11-05: 2 mg via INTRAVENOUS

## 2019-11-05 MED ORDER — VERAPAMIL HCL 2.5 MG/ML IV SOLN
INTRAVENOUS | Status: DC | PRN
  Administered 2019-11-05: 10 mL via INTRA_ARTERIAL

## 2019-11-05 MED ORDER — LIDOCAINE HCL (PF) 1 % IJ SOLN
INTRAMUSCULAR | Status: DC | PRN
  Administered 2019-11-05: 4 mL

## 2019-11-05 MED ORDER — ASPIRIN 81 MG PO CHEW: 81.0000 mg | CHEWABLE_TABLET | ORAL | Status: DC

## 2019-11-05 MED ORDER — SODIUM CHLORIDE 0.9% FLUSH
3.0000 mL | Freq: Two times a day (BID) | INTRAVENOUS | Status: DC
  Administered 2019-11-06: 3 mL via INTRAVENOUS

## 2019-11-05 MED ORDER — LIDOCAINE HCL (PF) 1 % IJ SOLN
INTRAMUSCULAR | Status: AC
  Filled 2019-11-05: qty 30

## 2019-11-05 MED ORDER — ASPIRIN 81 MG PO CHEW
81.0000 mg | CHEWABLE_TABLET | ORAL | Status: AC
  Administered 2019-11-05: 06:00:00 81 mg via ORAL

## 2019-11-05 MED ORDER — TICAGRELOR 90 MG PO TABS
ORAL_TABLET | ORAL | Status: AC
  Filled 2019-11-05: qty 2

## 2019-11-05 MED ORDER — NITROGLYCERIN 1 MG/10 ML FOR IR/CATH LAB
INTRA_ARTERIAL | Status: AC
  Filled 2019-11-05: qty 10

## 2019-11-05 MED ORDER — TICAGRELOR 90 MG PO TABS
90.0000 mg | ORAL_TABLET | Freq: Two times a day (BID) | ORAL | Status: DC
  Administered 2019-11-05 – 2019-11-06 (×2): 90 mg via ORAL
  Filled 2019-11-05 (×2): qty 1

## 2019-11-05 MED ORDER — ENOXAPARIN SODIUM 40 MG/0.4ML ~~LOC~~ SOLN: 40.0000 mg | SUBCUTANEOUS | Status: DC

## 2019-11-05 SURGICAL SUPPLY — 22 items
BALLN SAPPHIRE 2.5X15 (BALLOONS) ×2
BALLN ~~LOC~~ EMERGE MR 3.5X20 (BALLOONS) ×2
BALLOON SAPPHIRE 2.5X15 (BALLOONS) ×1 IMPLANT
BALLOON ~~LOC~~ EMERGE MR 3.5X20 (BALLOONS) ×1 IMPLANT
CATH 5FR JL3.5 JR4 ANG PIG MP (CATHETERS) ×2 IMPLANT
CATH LAUNCHER 6FR AL1 (CATHETERS) ×1 IMPLANT
CATHETER LAUNCHER 6FR AL1 (CATHETERS) ×2
DEVICE RAD COMP TR BAND LRG (VASCULAR PRODUCTS) ×2 IMPLANT
GLIDESHEATH SLEND SS 6F .021 (SHEATH) ×2 IMPLANT
GUIDEWIRE INQWIRE 1.5J.035X260 (WIRE) ×1 IMPLANT
INQWIRE 1.5J .035X260CM (WIRE) ×2
KIT ENCORE 26 ADVANTAGE (KITS) ×2 IMPLANT
KIT HEART LEFT (KITS) ×2 IMPLANT
PACK CARDIAC CATHETERIZATION (CUSTOM PROCEDURE TRAY) ×2 IMPLANT
STENT SYNERGY DES 3.5X20 (Permanent Stent) ×2 IMPLANT
STENT SYNERGY DES 3X24 (Permanent Stent) ×2 IMPLANT
STENT SYNERGY DES 3X38 (Permanent Stent) ×2 IMPLANT
SYR MEDRAD MARK 7 150ML (SYRINGE) ×2 IMPLANT
TRANSDUCER W/STOPCOCK (MISCELLANEOUS) ×2 IMPLANT
TUBING CIL FLEX 10 FLL-RA (TUBING) ×2 IMPLANT
WIRE ASAHI PROWATER 180CM (WIRE) ×2 IMPLANT
WIRE PT2 MS 185 (WIRE) ×2 IMPLANT

## 2019-11-05 NOTE — H&P (View-Only) (Signed)
Progress Note  Patient Name: Gary Graham Date of Encounter: 11/05/2019  Primary Cardiologist: New  Subjective   Denies any chest pain; no dyspnea  Inpatient Medications    Scheduled Meds: . aspirin EC  81 mg Oral Daily  . atorvastatin  80 mg Oral q1800  . metoprolol tartrate  12.5 mg Oral BID  . sodium chloride flush  3 mL Intravenous Once  . sodium chloride flush  3 mL Intravenous Q12H   Continuous Infusions: . sodium chloride    . heparin 1,800 Units/hr (11/05/19 0728)   PRN Meds: acetaminophen, morphine injection, nitroGLYCERIN, ondansetron (ZOFRAN) IV   Vital Signs    Vitals:   11/04/19 2014 11/04/19 2337 11/05/19 0340 11/05/19 0740  BP: 115/60 121/65 90/66 120/61  Pulse: 79 79 78 74  Resp: (!) 23 (!) 23 (!) 22 20  Temp: 98.1 F (36.7 C) 98.5 F (36.9 C) 97.6 F (36.4 C) 98 F (36.7 C)  TempSrc: Oral Oral Oral Oral  SpO2: 99% 99% 100% 96%  Weight:      Height:        Intake/Output Summary (Last 24 hours) at 11/05/2019 0759 Last data filed at 11/04/2019 1530 Gross per 24 hour  Intake 384.48 ml  Output -  Net 384.48 ml   Last 3 Weights 11/03/2019 06/14/2015  Weight (lbs) 218 lb 205 lb  Weight (kg) 98.884 kg 92.987 kg      Telemetry    Sinus - Personally Reviewed  ECG    Today- NSR, inferior TWI- Personally Reviewed  Physical Exam   GEN: No acute distress.   Neck: No JVD Cardiac: RRR, no murmurs, rubs, or gallops.  Respiratory: Clear to auscultation bilaterally. GI: Soft, nontender, non-distended  MS: No edema Neuro:  Nonfocal  Psych: Normal affect   Labs    High Sensitivity Troponin:   Recent Labs  Lab 10/30/19 2157 11/03/19 0321 11/03/19 0516  TROPONINIHS 15 5,012* 4,082*      Chemistry Recent Labs  Lab 11/03/19 0321 11/04/19 0218 11/05/19 0213  NA 137 139 138  K 3.9 4.2 3.9  CL 98 102 104  CO2 25 25 24   GLUCOSE 109* 117* 108*  BUN 10 20 11   CREATININE 1.12 1.11 1.05  CALCIUM 9.9 9.3 9.0  PROT  --   --  6.7   ALBUMIN  --   --  3.3*  AST  --   --  33  ALT  --   --  46*  ALKPHOS  --   --  62  BILITOT  --   --  0.5  GFRNONAA >60 >60 >60  GFRAA >60 >60 >60  ANIONGAP 14 12 10      Hematology Recent Labs  Lab 11/03/19 0321 11/04/19 0218 11/05/19 0213  WBC 10.8* 8.2 9.5  RBC 5.16 4.76 4.65  HGB 16.4 15.3 14.8  HCT 48.0 44.1 43.1  MCV 93.0 92.6 92.7  MCH 31.8 32.1 31.8  MCHC 34.2 34.7 34.3  RDW 11.9 11.7 11.6  PLT 360 347 369    Echo 11/03/19: IMPRESSIONS    1. Left ventricular ejection fraction, by visual estimation, is 50 to 55%. The left ventricle has mildly decreased function. Left ventricular septal wall thickness was mildly increased. Mildly increased left ventricular posterior wall thickness. There  is mildly increased left ventricular hypertrophy.  2. Mild hypokinesis of the anteroseptal and anterior myocardium.  3. Global right ventricle has normal systolic function.The right ventricular size is normal. No increase in right ventricular wall  thickness.  4. Left atrial size was normal.  5. Right atrial size was normal.  6. The mitral valve is normal in structure. No evidence of mitral valve regurgitation. No evidence of mitral stenosis.  7. The tricuspid valve is normal in structure. Tricuspid valve regurgitation is not demonstrated.  8. The aortic valve is tricuspid. Aortic valve regurgitation is not visualized. No evidence of aortic valve sclerosis or stenosis.  9. The pulmonic valve was normal in structure. Pulmonic valve regurgitation is not visualized. 10. TR signal is inadequate for assessing pulmonary artery systolic pressure. 11. The inferior vena cava is normal in size with greater than 50% respiratory variability, suggesting right atrial pressure of 3 mmHg.   Radiology    No results found.  Patient Profile     33 y.o. male with no prior cardiac history admitted with chest pain and elevated troponin.  Assessment & Plan    1. Non-ST elevation myocardial  infarction-patient presents with chest pain and troponin is elevated. Risk factors include tobacco use and mild HLD. LDL 85.  His symptoms have been persistent for 4 days and troponin now trending down.  ? Myocarditis versus ACS. Sed rate and CRP mildly elevated.  We will treat with aspirin and IV heparin, low-dose metoprolol and statin.  Echo showed EF 50-55% with anterior WMA. Plan cardiac cath today. The procedure and risks were reviewed including but not limited to death, myocardial infarction, stroke, arrythmias, bleeding, transfusion, emergency surgery, dye allergy, or renal dysfunction. The patient voices understanding and is agreeable to proceed.  2. Tobacco use. Counsel on smoking cessation.  3. Dyslipidemia. LDL 85. Low HDL 37.   For questions or updates, please contact [facility] Please consult [email or web address] for contact info under        Signed, Evoleht Hovatter [place], MD  11/05/2019, 7:59 AM

## 2019-11-05 NOTE — Progress Notes (Signed)
PROGRESS NOTE    Gary Graham  EUM:[id number] DOB: 12-Sep-1986 DOA: 11/03/2019 PCP: Patient, No Pcp Per   Brief Narrative:  Gary Graham is a 33 y.o. male who denies any significant past medical history and now presents to the emergency department for evaluation of chest pain.  Patient developed central chest pain proximately 4 days ago, started initially rest at the time, had associated nausea, was seen in the emergency department where high-sensitivity troponin was normal and he ended up leaving before being evaluated by a physician due to long wait time.  Pain subsided, but has returned back, seems to be worse with certain positions but not necessarily with exertion. In the ED EKG features a sinus rhythm with nonspecific T wave abnormality.  Chest x-ray is negative for acute cardiopulmonary disease.  Chemistry panel is unremarkable.  CBC notable for mild leukocytosis.  Troponin is elevated 5012.  Patient was given 324 mg of aspirin and 4 mg IV morphine in the ED.  COVID-19 screening test is negative.  Cardiology was consulted by the ED physician and was admitted for non-STEMI. Echo shows some abnormality,  patient is scheduled to have a cardiac cath today  Assessment & Plan:   Principal Problem:   NSTEMI (non-ST elevated myocardial infarction) (HCC)  1. NSTEMI  - Presents with chest pain, found to have non-specific T-wave abnormality on EKG, unremarkable CXR, and HS troponin of 5012 trending down. - No SOB, cough, or leg swelling / tenderness to suggest PE. - Denies current or recent viral sxs. - Denies cocaine of amphetamine use. - DDx includes myocarditis, vasospasm, SCAD, ACS   - Cardiology was consulted ,  Heparin gtt. - Patient was treated with ASA 324 in ED. - Echo shows Wall motion abnormality. - CAD must be excluded, plan cardiac cath today. - further plan will be decided after LHC , will f/u  DVT prophylaxis: Heparin gtt Code Status: Full Family Communication: D/w  patient. Disposition Plan: D/c Home if Cath normal  Consultants:   Cardiology   Procedures:  LHC scheduled today.  Antimicrobials: Anti-infectives (From admission, onward)   None      Subjective: Patient was seen and examined at bedside, he denies any chest pain and difficulty breathing.   No overnight events.   Objective: Vitals:   11/05/19 0340 11/05/19 0740 11/05/19 1112 11/05/19 1314  BP: 90/66 120/61 112/71   Pulse: 78 74 66   Resp: (!) 22 20 15    Temp: 97.6 F (36.4 C) 98 F (36.7 C) 98.1 F (36.7 C)   TempSrc: Oral Oral Oral   SpO2: 100% 96% 99% 99%  Weight:      Height:        Intake/Output Summary (Last 24 hours) at 11/05/2019 1420 Last data filed at 11/05/2019 1300 Gross per 24 hour  Intake 644.46 ml  Output -  Net 644.46 ml   Filed Weights   11/03/19 0319  Weight: 98.9 kg    Examination:  General exam: Appears calm and comfortable  Respiratory system: Clear to auscultation. Respiratory effort normal. Cardiovascular system: S1 & S2 heard, RRR. No JVD, murmurs, rubs, gallops or clicks. No pedal edema. Gastrointestinal system: Abdomen is nondistended, soft and nontender. No organomegaly or masses felt. Normal bowel sounds heard. Central nervous system: Alert and oriented. No focal neurological deficits. Extremities: Symmetric 5 x 5 power. Skin: No rashes, lesions or ulcers Psychiatry: Judgement and insight appear normal. Mood & affect appropriate.     Data Reviewed: I have personally  reviewed following labs and imaging studies  CBC: Recent Labs  Lab 10/30/19 2157 11/03/19 0321 11/04/19 0218 11/05/19 0213  WBC 9.8 10.8* 8.2 9.5  HGB 15.5 16.4 15.3 14.8  HCT 45.2 48.0 44.1 43.1  MCV 93.2 93.0 92.6 92.7  PLT 382 360 347 369   Basic Metabolic Panel: Recent Labs  Lab 10/30/19 2157 11/03/19 0321 11/04/19 0218 11/05/19 0213  NA 137 137 139 138  K 3.0* 3.9 4.2 3.9  CL 105 98 102 104  CO2 17* 25 25 24   GLUCOSE 143* 109* 117* 108*   BUN 12 10 20 11   CREATININE 1.13 1.12 1.11 1.05  CALCIUM 8.8* 9.9 9.3 9.0   GFR: Estimated Creatinine Clearance: 115 mL/min (by C-G formula based on SCr of 1.05 mg/dL). Liver Function Tests: Recent Labs  Lab 11/05/19 0213  AST 33  ALT 46*  ALKPHOS 62  BILITOT 0.5  PROT 6.7  ALBUMIN 3.3*   No results for input(s): LIPASE, AMYLASE in the last 168 hours. No results for input(s): AMMONIA in the last 168 hours. Coagulation Profile: No results for input(s): INR, PROTIME in the last 168 hours. Cardiac Enzymes: No results for input(s): CKTOTAL, CKMB, CKMBINDEX, TROPONINI in the last 168 hours. BNP (last 3 results) No results for input(s): PROBNP in the last 8760 hours. HbA1C: Recent Labs    11/04/19 0218  HGBA1C 5.5   CBG: No results for input(s): GLUCAP in the last 168 hours. Lipid Profile: Recent Labs    11/04/19 0218  CHOL 144  HDL 37*  LDLCALC 85  TRIG [id number]  CHOLHDL 3.9   Thyroid Function Tests: No results for input(s): TSH, T4TOTAL, FREET4, T3FREE, THYROIDAB in the last 72 hours. Anemia Panel: No results for input(s): VITAMINB12, FOLATE, FERRITIN, TIBC, IRON, RETICCTPCT in the last 72 hours. Sepsis Labs: No results for input(s): PROCALCITON, LATICACIDVEN in the last 168 hours.  Recent Results (from the past 240 hour(s))  SARS Coronavirus 2 by RT PCR (hospital order, performed in [facility] hospital lab) Nasopharyngeal Nasopharyngeal Swab     Status: None   Collection Time: 11/03/19  5:46 AM   Specimen: Nasopharyngeal Swab  Result Value Ref Range Status   SARS Coronavirus 2 NEGATIVE NEGATIVE Final    Comment: (NOTE) SARS-CoV-2 target nucleic acids are NOT DETECTED. The SARS-CoV-2 RNA is generally detectable in upper and lower respiratory specimens during the acute phase of infection. The lowest concentration of SARS-CoV-2 viral copies this assay can detect is 250 copies / mL. A negative result does not preclude SARS-CoV-2 infection and should not be used as  the sole basis for treatment or other patient management decisions.  A negative result may occur with improper specimen collection / handling, submission of specimen other than nasopharyngeal swab, presence of viral mutation(s) within the areas targeted by this assay, and inadequate number of viral copies (<250 copies / mL). A negative result must be combined with clinical observations, patient history, and epidemiological information. Fact Sheet for Patients:   BoilerBrush.com.cyhttps://www.fda.gov/media/136312/download Fact Sheet for Healthcare Providers: https://pope.com/https://www.fda.gov/media/136313/download This test is not yet approved or cleared  by the [place] FDA and has been authorized for detection and/or diagnosis of SARS-CoV-2 by FDA under an Emergency Use Authorization (EUA).  This EUA will remain in effect (meaning this test can be used) for the duration of the COVID-19 declaration under Section 564(b)(1) of the Act, 21 U.S.C. section 360bbb-3(b)(1), unless the authorization is terminated or revoked sooner. Performed at [facility], [address] [place],  [place] [address]   MRSA PCR Screening     Status: None   Collection Time: 11/03/19  9:53 PM   Specimen: Nasal Mucosa; Nasopharyngeal  Result Value Ref Range Status   MRSA by PCR NEGATIVE NEGATIVE Final    Comment:        The GeneXpert MRSA Assay (FDA approved for NASAL specimens only), is one component of a comprehensive MRSA colonization surveillance program. It is not intended to diagnose MRSA infection nor to guide or monitor treatment for MRSA infections. Performed at [facility], [place] [address] [place], [place] [address]      Radiology Studies: No results found. Scheduled Meds: . [MAR Hold] aspirin EC  81 mg Oral Daily  . [MAR Hold] atorvastatin  80 mg Oral q1800  . [MAR Hold] metoprolol tartrate  12.5 mg Oral BID  . [MAR Hold] sodium chloride flush  3 mL Intravenous Once  . [MAR Hold] sodium chloride  flush  3 mL Intravenous Q12H   Continuous Infusions: . sodium chloride    . sodium chloride 1 mL/kg/hr (11/05/19 1413)  . heparin Stopped (11/05/19 1250)     LOS: 1 day    Time spent: [place], MD [facility]   If 7PM-7AM, please contact night-coverage

## 2019-11-05 NOTE — Progress Notes (Signed)
Transported to the cath. Lab by bed awake and alert. 

## 2019-11-05 NOTE — Progress Notes (Signed)
Back from the cath lab by bed awake and alert. TR Band to right wrist intact with slight bleeding noted, site elevated with pillow, pulse ox to right thumb, Instructed to avoid moving the affected  arm but  Noted to be non-compliant at times. Continue to educate pt.

## 2019-11-05 NOTE — Progress Notes (Signed)
Progress Note  Patient Name: Gary Graham Date of Encounter: 11/05/2019  Primary Cardiologist: New  Subjective   Denies any chest pain; no dyspnea  Inpatient Medications    Scheduled Meds: . aspirin EC  81 mg Oral Daily  . atorvastatin  80 mg Oral q1800  . metoprolol tartrate  12.5 mg Oral BID  . sodium chloride flush  3 mL Intravenous Once  . sodium chloride flush  3 mL Intravenous Q12H   Continuous Infusions: . sodium chloride    . heparin 1,800 Units/hr (11/05/19 0728)   PRN Meds: acetaminophen, morphine injection, nitroGLYCERIN, ondansetron (ZOFRAN) IV   Vital Signs    Vitals:   11/04/19 2014 11/04/19 2337 11/05/19 0340 11/05/19 0740  BP: 115/60 121/65 90/66 120/61  Pulse: 79 79 78 74  Resp: (!) 23 (!) 23 (!) 22 20  Temp: 98.1 F (36.7 C) 98.5 F (36.9 C) 97.6 F (36.4 C) 98 F (36.7 C)  TempSrc: Oral Oral Oral Oral  SpO2: 99% 99% 100% 96%  Weight:      Height:        Intake/Output Summary (Last 24 hours) at 11/05/2019 0759 Last data filed at 11/04/2019 1530 Gross per 24 hour  Intake 384.48 ml  Output -  Net 384.48 ml   Last 3 Weights 11/03/2019 06/14/2015  Weight (lbs) 218 lb 205 lb  Weight (kg) 98.884 kg 92.987 kg      Telemetry    Sinus - Personally Reviewed  ECG    Today- NSR, inferior TWI- Personally Reviewed  Physical Exam   GEN: No acute distress.   Neck: No JVD Cardiac: RRR, no murmurs, rubs, or gallops.  Respiratory: Clear to auscultation bilaterally. GI: Soft, nontender, non-distended  MS: No edema Neuro:  Nonfocal  Psych: Normal affect   Labs    High Sensitivity Troponin:   Recent Labs  Lab 10/30/19 2157 11/03/19 0321 11/03/19 0516  TROPONINIHS 15 5,012* 4,082*      Chemistry Recent Labs  Lab 11/03/19 0321 11/04/19 0218 11/05/19 0213  NA 137 139 138  K 3.9 4.2 3.9  CL 98 102 104  CO2 25 25 24   GLUCOSE 109* 117* 108*  BUN 10 20 11   CREATININE 1.12 1.11 1.05  CALCIUM 9.9 9.3 9.0  PROT  --   --  6.7   ALBUMIN  --   --  3.3*  AST  --   --  33  ALT  --   --  46*  ALKPHOS  --   --  62  BILITOT  --   --  0.5  GFRNONAA >60 >60 >60  GFRAA >60 >60 >60  ANIONGAP 14 12 10      Hematology Recent Labs  Lab 11/03/19 0321 11/04/19 0218 11/05/19 0213  WBC 10.8* 8.2 9.5  RBC 5.16 4.76 4.65  HGB 16.4 15.3 14.8  HCT 48.0 44.1 43.1  MCV 93.0 92.6 92.7  MCH 31.8 32.1 31.8  MCHC 34.2 34.7 34.3  RDW 11.9 11.7 11.6  PLT 360 347 369    Echo 11/03/19: IMPRESSIONS    1. Left ventricular ejection fraction, by visual estimation, is 50 to 55%. The left ventricle has mildly decreased function. Left ventricular septal wall thickness was mildly increased. Mildly increased left ventricular posterior wall thickness. There  is mildly increased left ventricular hypertrophy.  2. Mild hypokinesis of the anteroseptal and anterior myocardium.  3. Global right ventricle has normal systolic function.The right ventricular size is normal. No increase in right ventricular wall  thickness.  4. Left atrial size was normal.  5. Right atrial size was normal.  6. The mitral valve is normal in structure. No evidence of mitral valve regurgitation. No evidence of mitral stenosis.  7. The tricuspid valve is normal in structure. Tricuspid valve regurgitation is not demonstrated.  8. The aortic valve is tricuspid. Aortic valve regurgitation is not visualized. No evidence of aortic valve sclerosis or stenosis.  9. The pulmonic valve was normal in structure. Pulmonic valve regurgitation is not visualized. 10. TR signal is inadequate for assessing pulmonary artery systolic pressure. 11. The inferior vena cava is normal in size with greater than 50% respiratory variability, suggesting right atrial pressure of 3 mmHg.   Radiology    No results found.  Patient Profile     33 y.o. male with no prior cardiac history admitted with chest pain and elevated troponin.  Assessment & Plan    1. Non-ST elevation myocardial  infarction-patient presents with chest pain and troponin is elevated. Risk factors include tobacco use and mild HLD. LDL 85.  His symptoms have been persistent for 4 days and troponin now trending down.  ? Myocarditis versus ACS. Sed rate and CRP mildly elevated.  We will treat with aspirin and IV heparin, low-dose metoprolol and statin.  Echo showed EF 50-55% with anterior WMA. Plan cardiac cath today. The procedure and risks were reviewed including but not limited to death, myocardial infarction, stroke, arrythmias, bleeding, transfusion, emergency surgery, dye allergy, or renal dysfunction. The patient voices understanding and is agreeable to proceed.  2. Tobacco use. Counsel on smoking cessation.  3. Dyslipidemia. LDL 85. Low HDL 37.   For questions or updates, please contact [facility] Please consult [email or web address] for contact info under        Signed, Peter [place], MD  11/05/2019, 7:59 AM

## 2019-11-05 NOTE — Progress Notes (Signed)
ANTICOAGULATION CONSULT NOTE - Follow-Up Consult  Pharmacy Consult for heparin Indication: chest pain/ACS  No Known Allergies  Patient Measurements: Height: 5\' 9"  (175.3 cm) Weight: 218 lb (98.9 kg) IBW/kg (Calculated) : 70.7 Heparin Dosing Weight: 91kg  Vital Signs: Temp: 97.6 F (36.4 C) (11/30 0340) Temp Source: Oral (11/30 0340) BP: 90/66 (11/30 0340) Pulse Rate: 78 (11/30 0340)  Labs: Recent Labs    11/03/19 0321 11/03/19 0516  11/04/19 0218 11/04/19 0916 11/05/19 0213  HGB 16.4  --   --  15.3  --  14.8  HCT 48.0  --   --  44.1  --  43.1  PLT 360  --   --  347  --  369  HEPARINUNFRC  --   --    < > 0.18* 0.41 0.35  CREATININE 1.12  --   --  1.11  --  1.05  TROPONINIHS 5,012* 4,082*  --   --   --   --    < > = values in this interval not displayed.    Estimated Creatinine Clearance: 115 mL/min (by C-G formula based on SCr of 1.05 mg/dL).   Medical History: Past Medical History:  Diagnosis Date  . Medical history non-contributory      Assessment: 5 yoM with no cardiac hx admitted with CP and elevated troponins. Pharmacy consulted to start IV heparin for ACS rule-out. No PTA anticoagulant, received full dose ASA while in ED. Plans for left heart cardiac cath today.  Heparin level on low end of therapeutic range after bolus plus rate increase, will increase to keep within therapeutic range. H&H low stable, no bleeding reported.   Goal of Therapy:  Heparin level 0.3-0.7 units/ml Monitor platelets by anticoagulation protocol: Yes   Plan:  Increase heparin to 1800 units/hr Daily heparin level and CBC Monitor for s/sx's of bleed   Thank you for allowing pharmacy to participate in this patient's care.  Chaye Misch L. Devin Going, [place] PGY1 Pharmacy Resident [phone] 11/05/19      7:28 AM  Please check AMION for all [place] phone numbers After 10:00 PM, call the [facility] [phone]

## 2019-11-05 NOTE — Interval H&P Note (Signed)
History and Physical Interval Note:  11/05/2019 1:15 PM  Gary Graham  has presented today for surgery, with the diagnosis of NSTEMI.  The various methods of treatment have been discussed with the patient and family. After consideration of risks, benefits and other options for treatment, the patient has consented to  Procedure(s): LEFT HEART CATH AND CORONARY ANGIOGRAPHY (N/A) as a surgical intervention.  The patient's history has been reviewed, patient examined, no change in status, stable for surgery.  I have reviewed the patient's chart and labs.  Questions were answered to the patient's satisfaction.   Cath Lab Visit (complete for each Cath Lab visit)  Clinical Evaluation Leading to the Procedure:   ACS: Yes.    Non-ACS:    Anginal Classification: CCS IV  Anti-ischemic medical therapy: No Therapy  Non-Invasive Test Results: No non-invasive testing performed  Prior CABG: No previous CABG        Collier [facility] 11/05/2019 1:15 PM

## 2019-11-06 ENCOUNTER — Encounter (HOSPITAL_COMMUNITY): Payer: Self-pay | Admitting: Cardiology

## 2019-11-06 DIAGNOSIS — Z955 Presence of coronary angioplasty implant and graft: Secondary | ICD-10-CM

## 2019-11-06 DIAGNOSIS — E785 Hyperlipidemia, unspecified: Secondary | ICD-10-CM

## 2019-11-06 DIAGNOSIS — Z720 Tobacco use: Secondary | ICD-10-CM | POA: Diagnosis present

## 2019-11-06 HISTORY — DX: Hyperlipidemia, unspecified: E78.5

## 2019-11-06 LAB — CBC
HCT: 44.5 % (ref 39.0–52.0)
Hemoglobin: 15.1 g/dL (ref 13.0–17.0)
MCH: 31.6 pg (ref 26.0–34.0)
MCHC: 33.9 g/dL (ref 30.0–36.0)
MCV: 93.1 fL (ref 80.0–100.0)
Platelets: 392 10*3/uL (ref 150–400)
RBC: 4.78 MIL/uL (ref 4.22–5.81)
RDW: 11.5 % (ref 11.5–15.5)
WBC: 9.3 10*3/uL (ref 4.0–10.5)
nRBC: 0 % (ref 0.0–0.2)

## 2019-11-06 LAB — BASIC METABOLIC PANEL
Anion gap: 13 (ref 5–15)
BUN: 8 mg/dL (ref 6–20)
CO2: 22 mmol/L (ref 22–32)
Calcium: 9 mg/dL (ref 8.9–10.3)
Chloride: 106 mmol/L (ref 98–111)
Creatinine, Ser: 0.88 mg/dL (ref 0.61–1.24)
GFR calc Af Amer: >60 mL/min (ref >60)
GFR calc non Af Amer: >60 mL/min (ref >60)
Glucose, Bld: 103 mg/dL — ABNORMAL HIGH (ref 70–99)
Potassium: 4 mmol/L (ref 3.5–5.1)
Sodium: 141 mmol/L (ref 135–145)

## 2019-11-06 MED ORDER — TICAGRELOR 90 MG PO TABS: 90.0000 mg | ORAL_TABLET | Freq: Two times a day (BID) | ORAL | 11 refills | Status: DC

## 2019-11-06 MED ORDER — ATORVASTATIN CALCIUM 80 MG PO TABS: 80.0000 mg | ORAL_TABLET | Freq: Every day | ORAL | 1 refills | Status: DC

## 2019-11-06 MED ORDER — NITROGLYCERIN 0.4 MG SL SUBL: 0.4000 mg | SUBLINGUAL_TABLET | SUBLINGUAL | 0 refills | Status: DC | PRN

## 2019-11-06 MED ORDER — METOPROLOL SUCCINATE ER 50 MG PO TB24
50.0000 mg | ORAL_TABLET | Freq: Every day | ORAL | Status: DC
  Administered 2019-11-06: 50 mg via ORAL
  Filled 2019-11-06: qty 1

## 2019-11-06 MED ORDER — ASPIRIN 81 MG PO TBEC: 81.0000 mg | DELAYED_RELEASE_TABLET | Freq: Every day | ORAL | 0 refills | Status: AC

## 2019-11-06 MED ORDER — METOPROLOL SUCCINATE ER 50 MG PO TB24: 50.0000 mg | ORAL_TABLET | Freq: Every day | ORAL | 1 refills | Status: DC

## 2019-11-06 MED FILL — NITROGLYCERIN 0.4 MG TAB SL: 0.4 | 8 days supply | Qty: 25 | Fill #0

## 2019-11-06 MED FILL — METOPROLOL SUCCINATE ER 50: 50 | 30 days supply | Qty: 30 | Fill #0

## 2019-11-06 MED FILL — BRILINTA 90 MG TABLET: 90 | 30 days supply | Qty: 60 | Fill #0

## 2019-11-06 MED FILL — ASPIRIN LOW DOSE 81 MG TBEC: 81 | 30 days supply | Qty: 30 | Fill #0

## 2019-11-06 MED FILL — ATORVASTATIN CALCIUM 80 MG: 80 | 30 days supply | Qty: 30 | Fill #0

## 2019-11-06 NOTE — Discharge Instructions (Signed)

## 2019-11-06 NOTE — Discharge Summary (Signed)
Physician Discharge Summary  Gary Graham Gary Graham DOB: [birth year] DOA: 11/03/2019  PCP: Gary Graham  Admit date: 11/03/2019 Discharge date: 11/06/2019  Time spent: 50 minutes  Recommendations for Outpatient Follow-up:  1. Follow-up with Dr. Peter Graham, cardiology in 2 weeks 2. [facility].  Patient to follow-up on 12/14/2019 at 1 PM.  On follow-up patient need a basic metabolic profile done to follow-up on electrolytes and renal function.   Discharge Diagnoses:  Principal Problem:   NSTEMI (non-ST elevated myocardial infarction) ([place]) Active Problems:   Tobacco abuse   Dyslipidemia   Status post coronary artery stent placement   Discharge Condition: Stable and improved  Diet recommendation: Heart healthy  Filed Weights   11/03/19 0319  Weight: 98.9 kg    History of present illness:  HPI Graham Dr. Monico Hoar D Graham is a 33 y.o. male who denies any significant past medical history and who presented to the emergency department for evaluation of chest pain.  Patient developed central chest pain proximately 4 days ago, was at rest at the time, had associated nausea, was seen in the emergency department where high-sensitivity troponin was normal and he ended up leaving before being evaluated by a physician due to long wait time.  Pain subsided, but has returned several times, seemed to be worse with certain positions but not necessarily with exertion.  He denied any nausea currently, has not had any shortness of breath associated with this, and denies diaphoresis.  He denies any recent fevers, chills, rhinorrhea, sore throat, or cough.  ED Course: Upon arrival to the ED, patient was found to be afebrile, saturating well on room air, slightly tachypneic, and with stable blood pressure.  EKG features a sinus rhythm with nonspecific T wave abnormality.  Chest x-ray is negative for acute  cardiopulmonary disease.  Chemistry panel is unremarkable.  CBC notable for mild leukocytosis.  Troponin is elevated 5012.  Patient was given 324 mg of aspirin and 4 mg IV morphine in the ED.  COVID-19 screening test is pending.  CRP, ESR, and repeat troponin are pending.  Cardiology was consulted by the ED physician and recommended medical admission  Hospital Course:  1 non-STEMI status post PCI of the RCA with DES x3 11/05/2019 Patient presented with chest pain found to have nonspecific T wave abnormalities on EKG.  Chest x-ray which was done was unremarkable.  High-sensitivity troponin was elevated as high as 5012 and started to trend down.  Cardiology consulted patient was given aspirin in the ED placed on a heparin drip.  2D echo which was done showed a EF of 50 to 55% with wall motion abnormalities of the anterior septal and anterior myocardium.  Cardiology followed the patient throughout the hospitalization.  Patient started on a beta-blocker.  Patient underwent left heart catheterization on 11/05/2019 that showed single-vessel occlusive coronary artery disease 100% mid RCA status post successful PCI of the RCA with DES x3.  Patient tolerated procedure well.  Patient started on high-dose statin.  Patient also placed on dual antiplatelet therapy with aspirin and Brilinta for 1 year Graham cardiology recommendations.  Patient remained asymptomatic on day of discharge.  Patient be discharged home on Toprol-XL 50 mg daily, high-dose statin, dual antiplatelet therapy with aspirin and Brilinta.  Tobacco cessation was stressed to patient.  Outpatient follow-up with cardiology.  2.  Tobacco abuse Tobacco cessation stressed to patient.  Outpatient follow-up.  3.  Dyslipidemia  Fasting lipid panel done with LDL of 85.  Patient started high-dose statin secondary to problem #1.  Outpatient follow-up.  Procedures:  2D echo 11/03/2019--EF 50 to 55%, left ventricular septal wall thickness mildly increased, mildly  increased left ventricular posterior wall thickness, mildly increased left ventricular hypertrophy.  Mild hypokinesis of the anteroseptal and anterior myocardium.  11/03/2019  Cardiac catheterization--single-vessel occlusive coronary artery disease, 100% mid RCA.  This is a large vessel with some left-to-right collaterals.  Low normal LV function with inferobasal hypokinesis.  Successful PCI of the RCA with DES x3, low LVEDP--11/05/2019 Graham Gary Graham      Consultations:  Cardiology: Dr. Marletta Graham 11/03/2019  Discharge Exam: Vitals:   11/06/19 0752 11/06/19 0940  BP: 134/85 130/75  Pulse: 91 73  Resp: 20   Temp: 98.3 F (36.8 C)   SpO2: 93%     General: NAD Cardiovascular: RRR Respiratory: CTAB  Discharge Instructions   Discharge Instructions    Amb Referral to Cardiac Rehabilitation   Complete by: As directed    Diagnosis:  Coronary Stents PTCA NSTEMI     After initial evaluation and assessments completed: Virtual Based Care may be provided alone or in conjunction with Phase 2 Cardiac Rehab based on patient barriers.: Yes   Diet - low sodium heart healthy   Complete by: As directed    Increase activity slowly   Complete by: As directed      Allergies as of 11/06/2019   No Known Allergies     Medication List    TAKE these medications   aspirin 81 MG EC tablet Take 1 tablet (81 mg total) by mouth daily. Start taking on: November 07, 2019   atorvastatin 80 MG tablet Commonly known as: LIPITOR Take 1 tablet (80 mg total) by mouth daily at 6 PM.   metoprolol succinate 50 MG 24 hr tablet Commonly known as: TOPROL-XL Take 1 tablet (50 mg total) by mouth daily. Take with or immediately following a meal. Start taking on: November 07, 2019   nitroGLYCERIN 0.4 MG SL tablet Commonly known as: NITROSTAT Place 1 tablet (0.4 mg total) under the tongue every 5 (five) minutes x 3 doses as needed for chest pain.   ticagrelor 90 MG Tabs tablet Commonly known as:  BRILINTA Take 1 tablet (90 mg total) by mouth 2 (two) times daily.      No Known Allergies Follow-up Information    [place] [facility] [place] Follow up on 12/14/2019.   Specialty: Internal Medicine Why: 1:00 for hospital follow up Contact information: [place] [phone] [place] Follow up.   Why: you can go here to get your meds at a discount and refill for brilinta for 10.00  while awaiting patient ast. Contact information: [place] [phone]       Graham, Gary M, MD. Schedule an appointment as soon as possible for a visit in 2 week(s).   Specialty: Cardiology Contact information: [address] [place] [address] [phone]            The results of significant diagnostics from this hospitalization (including imaging, microbiology, ancillary and laboratory) are listed below for reference.    Significant Diagnostic Studies: Dg Chest 2 View  Result Date: 11/03/2019 CLINICAL DATA:  Central chest pain EXAM: CHEST - 2 VIEW COMPARISON:  10/30/2019 FINDINGS: The heart size and mediastinal  contours are within normal limits. Both lungs are clear. The visualized skeletal structures are unremarkable. IMPRESSION: No active cardiopulmonary disease. Electronically Signed   By: Rolm Baptise M.D.   On: 11/03/2019 03:45   Dg Chest 2 View  Result Date: 10/30/2019 CLINICAL DATA:  Chest pain EXAM: CHEST - 2 VIEW COMPARISON:  Radiograph 06/19/2016 FINDINGS: No consolidation, features of edema, pneumothorax, or effusion. Pulmonary vascularity is normally distributed. The cardiomediastinal contours are unremarkable. No acute osseous or soft tissue abnormality. IMPRESSION: No acute cardiopulmonary abnormality. Electronically Signed   By: Lovena Le M.D.   On: 10/30/2019 22:14    Microbiology: Recent Results (from  the past 240 hour(s))  SARS Coronavirus 2 by RT PCR (hospital order, performed in [facility] hospital lab) Nasopharyngeal Nasopharyngeal Swab     Status: None   Collection Time: 11/03/19  5:46 AM   Specimen: Nasopharyngeal Swab  Result Value Ref Range Status   SARS Coronavirus 2 NEGATIVE NEGATIVE Final    Comment: (NOTE) SARS-CoV-2 target nucleic acids are NOT DETECTED. The SARS-CoV-2 RNA is generally detectable in upper and lower respiratory specimens during the acute phase of infection. The lowest concentration of SARS-CoV-2 viral copies this assay can detect is 250 copies / mL. A negative result does not preclude SARS-CoV-2 infection and should not be used as the sole basis for treatment or other patient management decisions.  A negative result may occur with improper specimen collection / handling, submission of specimen other than nasopharyngeal swab, presence of viral mutation(s) within the areas targeted by this assay, and inadequate number of viral copies (<250 copies / mL). A negative result must be combined with clinical observations, patient history, and epidemiological information. Fact Sheet for Patients:   StrictlyIdeas.no Fact Sheet for Healthcare Providers: BankingDealers.co.za This test is not yet approved or cleared  by the [place] FDA and has been authorized for detection and/or diagnosis of SARS-CoV-2 by FDA under an Emergency Use Authorization (EUA).  This EUA will remain in effect (meaning this test can be used) for the duration of the COVID-19 declaration under Section 564(b)(1) of the Act, 21 U.S.C. section 360bbb-3(b)(1), unless the authorization is terminated or revoked sooner. Performed at [facility], [place] [address] [place], [place] [address]   MRSA PCR Screening     Status: None   Collection Time: 11/03/19  9:53 PM   Specimen: Nasal Mucosa; Nasopharyngeal  Result Value Ref Range Status    MRSA by PCR NEGATIVE NEGATIVE Final    Comment:        The GeneXpert MRSA Assay (FDA approved for NASAL specimens only), is one component of a comprehensive MRSA colonization surveillance program. It is not intended to diagnose MRSA infection nor to guide or monitor treatment for MRSA infections. Performed at [facility], [place] [address], [place], [place] [address]      Labs: Basic Metabolic Panel: Recent Labs  Lab 10/30/19 2157 11/03/19 0321 11/04/19 0218 11/05/19 0213 11/06/19 0239  NA 137 137 139 138 141  K 3.0* 3.9 4.2 3.9 4.0  CL 105 98 102 104 106  CO2 17* 25 25 24 22   GLUCOSE 143* 109* 117* 108* 103*  BUN 12 10 20 11 8   CREATININE 1.13 1.12 1.11 1.05 0.88  CALCIUM 8.8* 9.9 9.3 9.0 9.0   Liver Function Tests: Recent Labs  Lab 11/05/19 0213  AST 33  ALT 46*  ALKPHOS 62  BILITOT 0.5  PROT 6.7  ALBUMIN 3.3*   No results for  input(s): LIPASE, AMYLASE in the last 168 hours. No results for input(s): AMMONIA in the last 168 hours. CBC: Recent Labs  Lab 10/30/19 2157 11/03/19 0321 11/04/19 0218 11/05/19 0213 11/06/19 0239  WBC 9.8 10.8* 8.2 9.5 9.3  HGB 15.5 16.4 15.3 14.8 15.1  HCT 45.2 48.0 44.1 43.1 44.5  MCV 93.2 93.0 92.6 92.7 93.1  PLT 382 360 347 369 392   Cardiac Enzymes: No results for input(s): CKTOTAL, CKMB, CKMBINDEX, TROPONINI in the last 168 hours. BNP: BNP (last 3 results) No results for input(s): BNP in the last 8760 hours.  ProBNP (last 3 results) No results for input(s): PROBNP in the last 8760 hours.  CBG: No results for input(s): GLUCAP in the last 168 hours.     Signed:  Irine Seal MD.  [facility] 11/06/2019, 10:33 AM

## 2019-11-06 NOTE — Progress Notes (Signed)
Progress Note  Patient Name: Gary Graham Date of Encounter: 11/06/2019  Primary Cardiologist: New- Dr Jens Som  Subjective   Denies any chest pain; no dyspnea. Feels well this am.  Inpatient Medications    Scheduled Meds: . aspirin EC  81 mg Oral Daily  . atorvastatin  80 mg Oral q1800  . metoprolol succinate  50 mg Oral Daily  . sodium chloride flush  3 mL Intravenous Once  . sodium chloride flush  3 mL Intravenous Q12H  . sodium chloride flush  3 mL Intravenous Q12H  . ticagrelor  90 mg Oral BID   Continuous Infusions: . sodium chloride     PRN Meds: sodium chloride, acetaminophen, morphine injection, nitroGLYCERIN, ondansetron (ZOFRAN) IV, sodium chloride flush   Vital Signs    Vitals:   11/05/19 2128 11/05/19 2300 11/06/19 0500 11/06/19 0752  BP: 140/72 126/81 119/70 134/85  Pulse: 97 90 79 91  Resp:  17 (!) 21 20  Temp:  (!) 97.3 F (36.3 C) 98.1 F (36.7 C) 98.3 F (36.8 C)  TempSrc:  Axillary Oral Oral  SpO2:  99% 99% 93%  Weight:      Height:        Intake/Output Summary (Last 24 hours) at 11/06/2019 0903 Last data filed at 11/06/2019 0100 Gross per 24 hour  Intake 1805 ml  Output -  Net 1805 ml   Last 3 Weights 11/03/2019 06/14/2015  Weight (lbs) 218 lb 205 lb  Weight (kg) 98.884 kg 92.987 kg      Telemetry    Sinus - Personally Reviewed  ECG    Today- NSR, inferior TWI- Personally Reviewed  Physical Exam   GEN: No acute distress.   Neck: No JVD Cardiac: RRR, no murmurs, rubs, or gallops.  Respiratory: Clear to auscultation bilaterally. GI: Soft, nontender, non-distended  MS: No edema. Radial cath site without hematoma Neuro:  Nonfocal  Psych: Normal affect   Labs    High Sensitivity Troponin:   Recent Labs  Lab 10/30/19 2157 11/03/19 0321 11/03/19 0516  TROPONINIHS 15 5,012* 4,082*      Chemistry Recent Labs  Lab 11/04/19 0218 11/05/19 0213 11/06/19 0239  NA 139 138 141  K 4.2 3.9 4.0  CL 102 104 106  CO2 25  24 22   GLUCOSE 117* 108* 103*  BUN 20 11 8   CREATININE 1.11 1.05 0.88  CALCIUM 9.3 9.0 9.0  PROT  --  6.7  --   ALBUMIN  --  3.3*  --   AST  --  33  --   ALT  --  46*  --   ALKPHOS  --  62  --   BILITOT  --  0.5  --   GFRNONAA >60 >60 >60  GFRAA >60 >60 >60  ANIONGAP 12 10 13      Hematology Recent Labs  Lab 11/04/19 0218 11/05/19 0213 11/06/19 0239  WBC 8.2 9.5 9.3  RBC 4.76 4.65 4.78  HGB 15.3 14.8 15.1  HCT 44.1 43.1 44.5  MCV 92.6 92.7 93.1  MCH 32.1 31.8 31.6  MCHC 34.7 34.3 33.9  RDW 11.7 11.6 11.5  PLT 347 369 392    Echo 11/03/19: IMPRESSIONS    1. Left ventricular ejection fraction, by visual estimation, is 50 to 55%. The left ventricle has mildly decreased function. Left ventricular septal wall thickness was mildly increased. Mildly increased left ventricular posterior wall thickness. There  is mildly increased left ventricular hypertrophy.  2. Mild hypokinesis of the anteroseptal  and anterior myocardium.  3. Global right ventricle has normal systolic function.The right ventricular size is normal. No increase in right ventricular wall thickness.  4. Left atrial size was normal.  5. Right atrial size was normal.  6. The mitral valve is normal in structure. No evidence of mitral valve regurgitation. No evidence of mitral stenosis.  7. The tricuspid valve is normal in structure. Tricuspid valve regurgitation is not demonstrated.  8. The aortic valve is tricuspid. Aortic valve regurgitation is not visualized. No evidence of aortic valve sclerosis or stenosis.  9. The pulmonic valve was normal in structure. Pulmonic valve regurgitation is not visualized. 10. TR signal is inadequate for assessing pulmonary artery systolic pressure. 11. The inferior vena cava is normal in size with greater than 50% respiratory variability, suggesting right atrial pressure of 3 mmHg.   Radiology    No results found.  Patient Profile     33 y.o. male with no prior cardiac  history admitted with chest pain and elevated troponin.  Assessment & Plan    1. Non-ST elevation myocardial infarction-patient presents with chest pain and troponin is elevated. Risk factors include tobacco use and mild HLD. LDL 85.     Echo showed EF 50-55% with anterior WMA. Cardiac cath demonstrated occlusion of the mid RCA with left to right collaterals. The LCA had no significant disease. Due to age and recurrent chest pain he did undergo PCI of the RCA with DES x 3 (due to dissection of proximal RCA. Clinically he has done well post PCI. On DAPT with ASA and Brilinta for one year. Will switch metoprolol to Toprol XL 50 mg daily. On high dose statin. He is stable for DC today. Will arrange close follow up in our office.   2. Tobacco use. Counseled on smoking cessation.  3. Dyslipidemia. LDL 85. Low HDL 37. Now on high dose statin.  For questions or updates, please contact [facility] Please consult [email or web address] for contact info under        Signed, Peter [place], MD  11/06/2019, 9:03 AM

## 2019-11-06 NOTE — Progress Notes (Signed)
Pt left unit in wheelchair for home , accompanied by N/A.meds received.

## 2019-11-06 NOTE — Progress Notes (Signed)
CARDIAC REHAB PHASE I   PRE:  Rate/Rhythm: 76 SR    BP: sitting 134/81    SaO2:   MODE:  Ambulation: 640 ft   POST:  Rate/Rhythm: 90 SR    BP: sitting 130/75     SaO2:   Tolerated well, no c/o. Ed completed with good reception. Discussed MI, stents, restrictions, Brilinta, smoking cessation, ETOH, diet, exercise, NTG, and CRPII. Will refer to [facility], prob for virtual. He understands the importance of Brilinta although does seem overwhelmed by all the change he needs. PTA not taking any meds.  He is motivated to quit smoking and has quit in the past. [place], ACSM 11/06/2019 9:27 AM

## 2019-11-06 NOTE — TOC Transition Note (Addendum)
Transition of Care [facility]) - CM/SW Discharge Note   Patient Details  Name: Gary Graham MRN: [id number] Date of Birth: Aug 07, 1986  [facility]) CM/SW Contact:  Zenon Mayo, RN Phone Number: 11/06/2019, 10:31 AM   Clinical Narrative:    Patient is for dc today, he is s/p stent intervention, will be on brilinta, NCM shceduled follow up apt for patient at the patient care center, NCM gave him the patient assistant application for brilinta to take to apt, and gave him brochures for the [facility] and the Patient care center.  He states he understands.  He has transport to go home . NCM assisted patient with [facility], TOC will bring meds to room prior to dc.   Final next level of care: Home/Self Care Barriers to Discharge: No Barriers Identified   Patient Goals and CMS Choice Patient states their goals for this hospitalization and ongoing recovery are:: get better   Choice offered to / list presented to : NA  Discharge Placement                       Discharge Plan and Services                          HH Arranged: NA          Social Determinants of Health (SDOH) Interventions     Readmission Risk Interventions No flowsheet data found.

## 2019-11-07 ENCOUNTER — Telehealth (HOSPITAL_COMMUNITY): Payer: Self-pay

## 2019-11-07 NOTE — Telephone Encounter (Signed)
Called patient to see if he is interested in the Cardiac Rehab Program. Patient expressed interest. Explained scheduling process, patient verbalized understanding. Will contact patient for scheduling once f/u has been completed.  

## 2019-11-16 ENCOUNTER — Other Ambulatory Visit: Payer: Self-pay

## 2019-11-16 ENCOUNTER — Encounter: Payer: Self-pay | Admitting: Physician Assistant

## 2019-11-16 ENCOUNTER — Ambulatory Visit (INDEPENDENT_AMBULATORY_CARE_PROVIDER_SITE_OTHER): Payer: Self-pay | Admitting: Physician Assistant

## 2019-11-16 VITALS — BP 120/68 | HR 73 | Temp 96.8°F | Ht 69.0 in | Wt 231.0 lb

## 2019-11-16 DIAGNOSIS — E785 Hyperlipidemia, unspecified: Secondary | ICD-10-CM

## 2019-11-16 DIAGNOSIS — I2510 Atherosclerotic heart disease of native coronary artery without angina pectoris: Secondary | ICD-10-CM

## 2019-11-16 MED ORDER — NITROGLYCERIN 0.4 MG SL SUBL: 0.4000 mg | SUBLINGUAL_TABLET | SUBLINGUAL | 3 refills | Status: AC | PRN

## 2019-11-16 MED ORDER — ATORVASTATIN CALCIUM 80 MG PO TABS: 80.0000 mg | ORAL_TABLET | Freq: Every day | ORAL | 3 refills | Status: DC

## 2019-11-16 MED ORDER — METOPROLOL SUCCINATE ER 50 MG PO TB24: 50.0000 mg | ORAL_TABLET | Freq: Every day | ORAL | 3 refills | Status: DC

## 2019-11-16 MED ORDER — TICAGRELOR 90 MG PO TABS: 90.0000 mg | ORAL_TABLET | Freq: Two times a day (BID) | ORAL | 11 refills | Status: DC

## 2019-11-16 NOTE — Progress Notes (Signed)
Cardiology Office Note:    Date:  11/19/2019   ID:  Gary Graham, DOB 03/18/1986, MRN [id number]  PCP:  Patient, No Pcp Per  Cardiologist:  Kirk Ruths, MD  Electrophysiologist:  None   Referring MD: No ref. provider found   Chief Complaint  Patient presents with  . Hospitalization Follow-up    post PCI for MI    History of Present Illness:    Gary Graham is a 33 y.o. male with a hx of hyperlipidemia.  Patient recently presented to the hospital on 10/26/2019 was chest pain.  He was ruled in for NSTEMI by enzymes.  Urine drug test was positive for opioid.  Initial echocardiogram obtained on 10/26/2019 showed EF 50 to 55%, mild hypokinesis of the anteroseptal and anterior myocardium.  Patient underwent cardiac catheterization on 11/05/2019 which revealed 100% proximal to mid RCA occlusion, this was treated with 3 overlapping Synergy DES, EF was 50 to 55%.  His RCA was a large vessel with some left-to-right collaterals.  Postprocedure, he was placed on aspirin and Brilinta with anticipation to continue DAPT for a minimum of 1 year.  Patient presents today to cardiology service for follow-up.  He denies any further chest discomfort since discharge from the hospital.  He has been compliant with aspirin and Brilinta.  He has not smoked in the past 2 weeks.  We have discussed repeatedly did need to be compliant with aspirin and Brilinta in order to prevent his stent from being thrombosed.  I see that his medication were sent to [facility] Transition of care pharmacy, we will [facility] an [facility].  He has follow-up with his new primary care provider over there in January.  He does not have insurance and that this is likely the least expensive way for him to obtain his medication.  He is aware that if the Brilinta cost too much, he is to immediately contact cardiology so we can switch him to Plavix.  He is aware that he needs lifelong aspirin and that he  needs a minimum of 12 months of Brilinta or Plavix.  Past Medical History:  Diagnosis Date  . Dyslipidemia 11/06/2019  . Medical history non-contributory     Past Surgical History:  Procedure Laterality Date  . CORONARY STENT INTERVENTION N/A 11/05/2019   Procedure: CORONARY STENT INTERVENTION;  Surgeon: Martinique, Peter M, MD;  Location: [place] [facility];  Service: Cardiovascular;  Laterality: N/A;  . LEFT HEART CATH AND CORONARY ANGIOGRAPHY N/A 11/05/2019   Procedure: LEFT HEART CATH AND CORONARY ANGIOGRAPHY;  Surgeon: Martinique, Peter M, MD;  Location: [place] [facility];  Service: Cardiovascular;  Laterality: N/A;    Current Medications: Current Meds  Medication Sig  . aspirin EC 81 MG EC tablet Take 1 tablet (81 mg total) by mouth daily.  Marland Kitchen atorvastatin (LIPITOR) 80 MG tablet Take 1 tablet (80 mg total) by mouth daily at 6 PM.  . metoprolol succinate (TOPROL-XL) 50 MG 24 hr tablet Take 1 tablet (50 mg total) by mouth daily. Take with or immediately following a meal.  . ticagrelor (BRILINTA) 90 MG TABS tablet Take 1 tablet (90 mg total) by mouth 2 (two) times daily.  . [DISCONTINUED] atorvastatin (LIPITOR) 80 MG tablet Take 1 tablet (80 mg total) by mouth daily at 6 PM.  . [DISCONTINUED] metoprolol succinate (TOPROL-XL) 50 MG 24 hr tablet Take 1 tablet (50 mg total) by mouth daily. Take with or immediately following a meal.  . [  DISCONTINUED] ticagrelor (BRILINTA) 90 MG TABS tablet Take 1 tablet (90 mg total) by mouth 2 (two) times daily.     Allergies:   Patient has no known allergies.   Social History   Socioeconomic History  . Marital status: Single    Spouse name: Not on file  . Number of children: Not on file  . Years of education: Not on file  . Highest education level: Not on file  Occupational History  . Not on file  Tobacco Use  . Smoking status: Former Smoker    Quit date: 11/02/2019    Years since quitting: 0.0  . Smokeless tobacco: Never Used  Substance and  Sexual Activity  . Alcohol use: Yes    Comment: states he drinks most days a week  . Drug use: No  . Sexual activity: Yes  Other Topics Concern  . Not on file  Social History Narrative  . Not on file   Social Determinants of Health   Financial Resource Strain:   . Difficulty of Paying Living Expenses: Not on file  Food Insecurity:   . Worried About Programme researcher, broadcasting/film/videounning Out of Food in the Last Year: Not on file  . Ran Out of Food in the Last Year: Not on file  Transportation Needs:   . Lack of Transportation (Medical): Not on file  . Lack of Transportation (Non-Medical): Not on file  Physical Activity:   . Days of Exercise per Week: Not on file  . Minutes of Exercise per Session: Not on file  Stress:   . Feeling of Stress : Not on file  Social Connections:   . Frequency of Communication with Friends and Family: Not on file  . Frequency of Social Gatherings with Friends and Family: Not on file  . Attends Religious Services: Not on file  . Active Member of Clubs or Organizations: Not on file  . Attends BankerClub or Organization Meetings: Not on file  . Marital Status: Not on file     Family History: The patient's family history includes Heart attack in his maternal grandfather; Hypertension in his maternal grandmother.  ROS:   Please see the history of present illness.     All other systems reviewed and are negative.  EKGs/Labs/Other Studies Reviewed:    The following studies were reviewed today:  Cath 11/05/2019  Prox RCA to Mid RCA lesion is 100% stenosed.  Post intervention, there is a 0% residual stenosis.  A drug-eluting stent was successfully placed using a STENT SYNERGY DES 3X38.  A drug-eluting stent was successfully placed using a STENT SYNERGY DES 3X24.  A drug-eluting stent was successfully placed using a STENT SYNERGY DES 3.5X20.  The left ventricular systolic function is normal.  LV end diastolic pressure is normal.  The left ventricular ejection fraction is  50-55% by visual estimate.   1. Single vessel occlusive CAD. 100% mid RCA. This is a large vessel with some left to right collaterals. 2. Low normal LV function with inferobasal hypokinesis 3. Successful PCI of the RCA with DES x 3 4. Low LVEDP  Plan: DAPT for one year. Anticipate DC in am if stable.    EKG:  EKG is ordered today.  The ekg ordered today demonstrates normal sinus rhythm with T wave inversion in the inferior leads.  Recent Labs: 11/05/2019: ALT 46 11/06/2019: BUN 8; Creatinine, Ser 0.88; Hemoglobin 15.1; Platelets 392; Potassium 4.0; Sodium 141  Recent Lipid Panel    Component Value Date/Time   CHOL 144 11/04/2019 0218  TRIG 111 11/04/2019 0218   HDL 37 (L) 11/04/2019 0218   CHOLHDL 3.9 11/04/2019 0218   VLDL 22 11/04/2019 0218   LDLCALC 85 11/04/2019 0218    Physical Exam:    VS:  BP 120/68   Pulse 73   Temp (!) 96.8 F (36 C)   Ht 5\' 9"  (1.753 m)   Wt 231 lb (104.8 kg)   SpO2 98%   BMI 34.11 kg/m     Wt Readings from Last 3 Encounters:  11/16/19 231 lb (104.8 kg)  11/03/19 218 lb (98.9 kg)  06/14/15 205 lb (93 kg)     GEN:  Well nourished, well developed in no acute distress HEENT: Normal NECK: No JVD; No carotid bruits LYMPHATICS: No lymphadenopathy CARDIAC: RRR, no murmurs, rubs, gallops RESPIRATORY:  Clear to auscultation without rales, wheezing or rhonchi  ABDOMEN: Soft, non-tender, non-distended MUSCULOSKELETAL:  No edema; No deformity  SKIN: Warm and dry NEUROLOGIC:  Alert and oriented x 3 PSYCHIATRIC:  Normal affect   ASSESSMENT:    1. Coronary artery disease involving native coronary artery of native heart without angina pectoris   2. Hyperlipidemia, unspecified hyperlipidemia type    PLAN:    In order of problems listed above:  1. CAD: Recently inferior STEMI, single-vessel disease with 100% occluded RCA treated with overlapping stents.  I repeatedly emphasized the importance of compliance with aspirin and Brilinta.  We will  [facility] in order for him to obtain the medication and reasonable cost.  Unfortunately he does not have insurance.  He is aware that if he is unable to obtain Brilinta for any reason he will need to let 08/15/15 know immediately.  He is aware that he needs to be on lifelong aspirin and Brilinta for minimum of 12 months.  2. Hyperlipidemia: Continue high intensity statin.  Fasting lipid panel LFT in 6 to 8 weeks.   Medication Adjustments/Labs and Tests Ordered: Current medicines are reviewed at length with the patient today.  Concerns regarding medicines are outlined above.  Orders Placed This Encounter  Procedures  . Lipid panel  . Hepatic function panel  . EKG 12-Lead   Meds ordered this encounter  Medications  . atorvastatin (LIPITOR) 80 MG tablet    Sig: Take 1 tablet (80 mg total) by mouth daily at 6 PM.    Dispense:  90 tablet    Refill:  3  . metoprolol succinate (TOPROL-XL) 50 MG 24 hr tablet    Sig: Take 1 tablet (50 mg total) by mouth daily. Take with or immediately following a meal.    Dispense:  90 tablet    Refill:  3  . nitroGLYCERIN (NITROSTAT) 0.4 MG SL tablet    Sig: Place 1 tablet (0.4 mg total) under the tongue every 5 (five) minutes x 3 doses as needed for chest pain.    Dispense:  30 tablet    Refill:  3  . ticagrelor (BRILINTA) 90 MG TABS tablet    Sig: Take 1 tablet (90 mg total) by mouth 2 (two) times daily.    Dispense:  60 tablet    Refill:  11    Patient Instructions  Medication Instructions:   Your physician recommends that you continue on your current medications as directed. Please refer to the Current Medication list given to you today.  *If you need a refill on your cardiac medications before your next appointment, please call your pharmacy*  Lab Work: Your physician  recommends that you return for lab work in 6 WEEKS:  FASTING LIPID PANEL-DO NOT EAT OR DRINK PAST MIDNIGHT. (OKAY TO DRINK  WATER)  LIVER FUNCTION TEST  If you have labs (blood work) drawn today and your tests are completely normal, you will receive your results only by: Marland Kitchen MyChart Message (if you have MyChart) OR . A paper copy in the mail If you have any lab test that is abnormal or we need to change your treatment, we will call you to review the results.  Testing/Procedures: NONE ordered at this time of appointment   Follow-Up: At [facility], you and your health needs are our priority.  As part of our continuing mission to provide you with exceptional heart care, we have created designated Provider Care Teams.  These Care Teams include your primary Cardiologist (physician) and Advanced Practice Providers (APPs -  Physician Assistants and Nurse Practitioners) who all work together to provide you with the care you need, when you need it.  Your next appointment:   2-3 month(s)  The format for your next appointment:   In Person  Provider:   Olga Millers, MD  Other Instructions      Signed, Azalee Course, PA  11/19/2019 12:01 AM    [facility]

## 2019-11-16 NOTE — Patient Instructions (Signed)
Medication Instructions:   Your physician recommends that you continue on your current medications as directed. Please refer to the Current Medication list given to you today.  *If you need a refill on your cardiac medications before your next appointment, please call your pharmacy*  Lab Work: Your physician recommends that you return for lab work in [place] MIDNIGHT. (OKAY TO [place])  LIVER FUNCTION TEST  If you have labs (blood work) drawn today and your tests are completely normal, you will receive your results only by: Marland Kitchen MyChart Message (if you have MyChart) OR . A paper copy in the mail If you have any lab test that is abnormal or we need to change your treatment, we will call you to review the results.  Testing/Procedures: NONE ordered at this time of appointment   Follow-Up: At [facility], you and your health needs are our priority.  As part of our continuing mission to provide you with exceptional heart care, we have created designated Provider Care Teams.  These Care Teams include your primary Cardiologist (physician) and Advanced Practice Providers (APPs -  Physician Assistants and Nurse Practitioners) who all work together to provide you with the care you need, when you need it.  Your next appointment:   2-3 month(s)  The format for your next appointment:   In Person  Provider:   Kirk Ruths, MD  Other Instructions

## 2019-11-19 ENCOUNTER — Encounter: Payer: Self-pay | Admitting: Physician Assistant

## 2019-11-27 ENCOUNTER — Telehealth (HOSPITAL_COMMUNITY): Payer: Self-pay

## 2019-11-27 NOTE — Telephone Encounter (Signed)
Called to advise pt that given the surge/increase in the Covid-19 cases and hospitalizations, our [place] senior leadership has asked [place] to halt onsite Cardiac and Pulmonary rehab exercise sessions and move patients to the Virtual app we have with [place]. The reason for this is so that department staff can be deployed to the inpatient nursing floors to assist those teams in need. Leadership anticipates this need will be 30-60 days however, it could be extended if needed. Advised pt to attend the Orientation assessment but pt is not interested in virtual cardiac rehab and wants a call back once we are open back to in house cardiac rehab.

## 2019-12-04 MED FILL — ATORVASTATIN 80 MG TABLET: 80 | 30 days supply | Qty: 30 | Fill #0

## 2019-12-04 MED FILL — !BRILINTA 90 MG TABLET: 30 days supply | Qty: 60 | Fill #0

## 2019-12-04 MED FILL — METOPROLOL SUCCINATE ER 50: 50 | 30 days supply | Qty: 30 | Fill #0

## 2019-12-14 ENCOUNTER — Ambulatory Visit: Payer: Self-pay | Admitting: Family Medicine

## 2019-12-27 MED FILL — METOPROLOL SUCCINATE ER 50: 50 | 30 days supply | Qty: 30 | Fill #1

## 2019-12-27 MED FILL — NITROGLYCERIN 0.4 MG TAB SL: 0.4 | 25 days supply | Qty: 25 | Fill #0

## 2019-12-27 MED FILL — !BRILINTA 90 MG TABLET: 30 days supply | Qty: 60 | Fill #1

## 2019-12-27 MED FILL — ATORVASTATIN 80 MG TABLET: 80 | 30 days supply | Qty: 30 | Fill #1

## 2020-01-29 ENCOUNTER — Telehealth (HOSPITAL_COMMUNITY): Payer: Self-pay

## 2020-01-29 ENCOUNTER — Encounter (HOSPITAL_COMMUNITY): Payer: Self-pay

## 2020-01-29 NOTE — Telephone Encounter (Signed)
Attempted to call pt a 2nd time in regards to cardiac rehab- Unable to leave VM  Mailed letter out

## 2020-01-30 MED FILL — !BRILINTA 90 MG TABLET: 30 days supply | Qty: 60 | Fill #2

## 2020-01-30 MED FILL — ATORVASTATIN 80 MG TABLET: 80 | 30 days supply | Qty: 30 | Fill #2

## 2020-01-30 MED FILL — METOPROLOL SUCCINATE ER 50: 50 | 30 days supply | Qty: 30 | Fill #2

## 2020-02-07 NOTE — Progress Notes (Signed)
HPI: FU CAD.  Patient was admitted November 2020 with non-ST elevation myocardial infarction.  Echocardiogram showed ejection fraction 50 to 55%.  Cardiac catheterization showed an occluded right coronary artery which was treated with 3 drug-eluting stents. Since last seen, the patient denies any dyspnea on exertion, orthopnea, PND, pedal edema, palpitations, syncope or chest pain.   Current Outpatient Medications  Medication Sig Dispense Refill  . aspirin EC 81 MG EC tablet Take 1 tablet (81 mg total) by mouth daily. 30 tablet 0  . atorvastatin (LIPITOR) 80 MG tablet Take 1 tablet (80 mg total) by mouth daily at 6 PM. 90 tablet 3  . metoprolol succinate (TOPROL-XL) 50 MG 24 hr tablet Take 1 tablet (50 mg total) by mouth daily. Take with or immediately following a meal. 90 tablet 3  . nitroGLYCERIN (NITROSTAT) 0.4 MG SL tablet Place 1 tablet (0.4 mg total) under the tongue every 5 (five) minutes x 3 doses as needed for chest pain. 30 tablet 3  . ticagrelor (BRILINTA) 90 MG TABS tablet Take 1 tablet (90 mg total) by mouth 2 (two) times daily. 60 tablet 11   No current facility-administered medications for this visit.     Past Medical History:  Diagnosis Date  . Dyslipidemia 11/06/2019  . Medical history non-contributory     Past Surgical History:  Procedure Laterality Date  . CORONARY STENT INTERVENTION N/A 11/05/2019   Procedure: CORONARY STENT INTERVENTION;  Surgeon: Martinique, Peter M, MD;  Location: [place] [facility];  Service: Cardiovascular;  Laterality: N/A;  . LEFT HEART CATH AND CORONARY ANGIOGRAPHY N/A 11/05/2019   Procedure: LEFT HEART CATH AND CORONARY ANGIOGRAPHY;  Surgeon: Martinique, Peter M, MD;  Location: [place] [facility];  Service: Cardiovascular;  Laterality: N/A;    Social History   Socioeconomic History  . Marital status: Single    Spouse name: Not on file  . Number of children: Not on file  . Years of education: Not on file  . Highest education level: Not  on file  Occupational History  . Not on file  Tobacco Use  . Smoking status: Former Smoker    Quit date: 11/02/2019    Years since quitting: 0.2  . Smokeless tobacco: Never Used  Substance and Sexual Activity  . Alcohol use: Yes    Comment: states he drinks most days a week  . Drug use: No  . Sexual activity: Yes  Other Topics Concern  . Not on file  Social History Narrative  . Not on file   Social Determinants of Health   Financial Resource Strain:   . Difficulty of Paying Living Expenses:   Food Insecurity:   . Worried About Charity fundraiser in the Last Year:   . Arboriculturist in the Last Year:   Transportation Needs:   . Film/video editor (Medical):   Marland Kitchen Lack of Transportation (Non-Medical):   Physical Activity:   . Days of Exercise per Week:   . Minutes of Exercise per Session:   Stress:   . Feeling of Stress :   Social Connections:   . Frequency of Communication with Friends and Family:   . Frequency of Social Gatherings with Friends and Family:   . Attends Religious Services:   . Active Member of Clubs or Organizations:   . Attends Archivist Meetings:   Marland Kitchen Marital Status:   Intimate Partner Violence:   . Fear of Current or Ex-Partner:   .  Emotionally Abused:   Marland Kitchen Physically Abused:   . Sexually Abused:     Family History  Problem Relation Age of Onset  . Hypertension Maternal Grandmother   . Heart attack Maternal Grandfather     ROS: no fevers or chills, productive cough, hemoptysis, dysphasia, odynophagia, melena, hematochezia, dysuria, hematuria, rash, seizure activity, orthopnea, PND, pedal edema, claudication. Remaining systems are negative.  Physical Exam: Well-developed well-nourished in no acute distress.  Skin is warm and dry.  HEENT is normal.  Neck is supple.  Chest is clear to auscultation with normal expansion.  Cardiovascular exam is regular rate and rhythm.  Abdominal exam nontender or distended. No masses  palpated. Extremities show no edema. neuro grossly intact  A/P  1 coronary artery disease-continue aspirin, Brilinta and statin.  Will discontinue Brilinta November 2021.  2 hyperlipidemia-continue Lipitor.  Check lipids and liver.  3 hypertension-blood pressure controlled.  Continue present medications.  Olga Millers, MD

## 2020-02-14 ENCOUNTER — Other Ambulatory Visit: Payer: Self-pay

## 2020-02-14 ENCOUNTER — Encounter: Payer: Self-pay | Admitting: Cardiology

## 2020-02-14 ENCOUNTER — Encounter (HOSPITAL_COMMUNITY): Payer: Self-pay | Admitting: *Deleted

## 2020-02-14 ENCOUNTER — Ambulatory Visit (INDEPENDENT_AMBULATORY_CARE_PROVIDER_SITE_OTHER): Payer: Self-pay | Admitting: Cardiology

## 2020-02-14 VITALS — BP 130/78 | HR 76 | Temp 96.8°F | Ht 69.0 in | Wt 235.2 lb

## 2020-02-14 DIAGNOSIS — I10 Essential (primary) hypertension: Secondary | ICD-10-CM

## 2020-02-14 DIAGNOSIS — I2510 Atherosclerotic heart disease of native coronary artery without angina pectoris: Secondary | ICD-10-CM

## 2020-02-14 DIAGNOSIS — E785 Hyperlipidemia, unspecified: Secondary | ICD-10-CM

## 2020-02-14 NOTE — Progress Notes (Signed)
No response from please contact letter sent to pt on 01/29/20.  Will close this referral for cardiac rehab. Alanson Aly, BSN Cardiac and Emergency planning/management officer

## 2020-02-14 NOTE — Patient Instructions (Signed)
Medication Instructions:  NO CHANGE *If you need a refill on your cardiac medications before your next appointment, please call your pharmacy*   Lab Work: Your physician recommends that you return FOR LAB WORK PRIOR TO EATING If you have labs (blood work) drawn today and your tests are completely normal, you will receive your results only by: Marland Kitchen MyChart Message (if you have MyChart) OR . A paper copy in the mail If you have any lab test that is abnormal or we need to change your treatment, we will call you to review the results.   Follow-Up: At [facility], you and your health needs are our priority.  As part of our continuing mission to provide you with exceptional heart care, we have created designated Provider Care Teams.  These Care Teams include your primary Cardiologist (physician) and Advanced Practice Providers (APPs -  Physician Assistants and Nurse Practitioners) who all work together to provide you with the care you need, when you need it.  We recommend signing up for the patient portal called "MyChart".  Sign up information is provided on this After Visit Summary.  MyChart is used to connect with patients for Virtual Visits (Telemedicine).  Patients are able to view lab/test results, encounter notes, upcoming appointments, etc.  Non-urgent messages can be sent to your provider as well.   To learn more about what you can do with MyChart, go to [email or web address].    Your next appointment:   6 month(s)  The format for your next appointment:   Either In Person or Virtual  Provider:   Olga Millers, MD

## 2020-03-03 MED FILL — ATORVASTATIN 80 MG TABLET: 80 | 30 days supply | Qty: 30 | Fill #3

## 2020-03-03 MED FILL — !BRILINTA 90 MG TABLET: 30 days supply | Qty: 60 | Fill #3

## 2020-03-03 MED FILL — METOPROLOL SUCCINATE ER 50: 50 | 30 days supply | Qty: 30 | Fill #3

## 2020-04-07 ENCOUNTER — Encounter: Payer: Self-pay | Admitting: *Deleted

## 2020-04-09 MED FILL — METOPROLOL SUCCINATE ER 50: 50 | 30 days supply | Qty: 30 | Fill #4

## 2020-04-09 MED FILL — ATORVASTATIN 80 MG TABLET: 80 | 30 days supply | Qty: 30 | Fill #4

## 2020-05-09 MED FILL — $BRILINTA 90 MG TABLET: 90 | 90 days supply | Qty: 180 | Fill #4

## 2020-05-09 MED FILL — METOPROLOL SUCCINATE ER 50: 50 | 30 days supply | Qty: 30 | Fill #5

## 2020-05-09 MED FILL — ATORVASTATIN 80 MG TABLET: 80 | 30 days supply | Qty: 30 | Fill #5

## 2020-06-18 MED FILL — METOPROLOL SUCCINATE ER 50: 50 | 30 days supply | Qty: 30 | Fill #6

## 2020-06-18 MED FILL — ATORVASTATIN 80 MG TABLET: 80 | 30 days supply | Qty: 30 | Fill #6

## 2020-08-13 ENCOUNTER — Telehealth: Payer: Self-pay | Admitting: Cardiology

## 2020-08-13 ENCOUNTER — Other Ambulatory Visit: Payer: Self-pay

## 2020-08-13 ENCOUNTER — Other Ambulatory Visit: Payer: Self-pay | Admitting: Cardiology

## 2020-08-13 MED ORDER — METOPROLOL SUCCINATE ER 50 MG PO TB24: 50.0000 mg | ORAL_TABLET | Freq: Every day | ORAL | 1 refills | Status: DC

## 2020-08-13 MED ORDER — TICAGRELOR 90 MG PO TABS: 90.0000 mg | ORAL_TABLET | Freq: Two times a day (BID) | ORAL | 1 refills | Status: DC

## 2020-08-13 MED ORDER — ATORVASTATIN CALCIUM 80 MG PO TABS: 80.0000 mg | ORAL_TABLET | Freq: Every day | ORAL | 1 refills | Status: DC

## 2020-08-13 MED ORDER — TICAGRELOR 90 MG PO TABS: 90.0000 mg | ORAL_TABLET | Freq: Two times a day (BID) | ORAL | 1 refills | Status: AC

## 2020-08-13 MED FILL — ATORVASTATIN 80 MG TABLET: 80 | 30 days supply | Qty: 30 | Fill #0

## 2020-08-13 MED FILL — METOPROLOL SUCCINATE ER 50: 50 | 30 days supply | Qty: 30 | Fill #0

## 2020-08-13 MED FILL — $BRILINTA 90 MG TABLET: 90 | 30 days supply | Qty: 60 | Fill #0

## 2020-08-13 NOTE — Telephone Encounter (Signed)
*  STAT* If patient is at the pharmacy, call can be transferred to refill team.   1. Which medications need to be refilled? (please list name of each medication and dose if known) ticagrelor (BRILINTA) 90 MG TABS tablet metoprolol succinate (TOPROL-XL) 50 MG 24 hr tablet atorvastatin (LIPITOR) 80 MG tablet  2. Which pharmacy/location (including street and city if local pharmacy) is medication to be sent to? [facility] [place] [place] - [place] [address]  3. Do they need a 30 day or 90 day supply? 90 day supply

## 2020-08-14 ENCOUNTER — Other Ambulatory Visit: Payer: Self-pay

## 2020-08-28 ENCOUNTER — Telehealth: Payer: Self-pay | Admitting: Cardiology

## 2020-08-28 ENCOUNTER — Telehealth: Payer: Self-pay

## 2020-08-28 NOTE — Telephone Encounter (Signed)
°  Patient Consent for Virtual Visit         Gary Graham has provided verbal consent on 08/28/2020 for a virtual visit (video or telephone).   CONSENT FOR VIRTUAL VISIT FOR:  Gary Graham  By participating in this virtual visit I agree to the following:  I hereby voluntarily request, consent and authorize [facility] and its employed or contracted physicians, physician assistants, nurse practitioners or other licensed health care professionals (the Practitioner), to provide me with telemedicine health care services (the Services") as deemed necessary by the treating Practitioner. I acknowledge and consent to receive the Services by the Practitioner via telemedicine. I understand that the telemedicine visit will involve communicating with the Practitioner through live audiovisual communication technology and the disclosure of certain medical information by electronic transmission. I acknowledge that I have been given the opportunity to request an in-person assessment or other available alternative prior to the telemedicine visit and am voluntarily participating in the telemedicine visit.  I understand that I have the right to withhold or withdraw my consent to the use of telemedicine in the course of my care at any time, without affecting my right to future care or treatment, and that the Practitioner or I may terminate the telemedicine visit at any time. I understand that I have the right to inspect all information obtained and/or recorded in the course of the telemedicine visit and may receive copies of available information for a reasonable fee.  I understand that some of the potential risks of receiving the Services via telemedicine include:   Delay or interruption in medical evaluation due to technological equipment failure or disruption;  Information transmitted may not be sufficient (e.g. poor resolution of images) to allow for appropriate medical decision making by the Practitioner;  and/or   In rare instances, security protocols could fail, causing a breach of personal health information.  Furthermore, I acknowledge that it is my responsibility to provide information about my medical history, conditions and care that is complete and accurate to the best of my ability. I acknowledge that Practitioner's advice, recommendations, and/or decision may be based on factors not within their control, such as incomplete or inaccurate data provided by me or distortions of diagnostic images or specimens that may result from electronic transmissions. I understand that the practice of medicine is not an exact science and that Practitioner makes no warranties or guarantees regarding treatment outcomes. I acknowledge that a copy of this consent can be made available to me via my patient portal [facility] MyChart), or I can request a printed copy by calling the office of [facility].    I understand that my insurance will be billed for this visit.   I have read or had this consent read to me.  I understand the contents of this consent, which adequately explains the benefits and risks of the Services being provided via telemedicine.   I have been provided ample opportunity to ask questions regarding this consent and the Services and have had my questions answered to my satisfaction.  I give my informed consent for the services to be provided through the use of telemedicine in my medical care

## 2020-08-29 ENCOUNTER — Telehealth (INDEPENDENT_AMBULATORY_CARE_PROVIDER_SITE_OTHER): Payer: Self-pay | Admitting: Physician Assistant

## 2020-08-29 DIAGNOSIS — E785 Hyperlipidemia, unspecified: Secondary | ICD-10-CM

## 2020-08-29 DIAGNOSIS — Z79899 Other long term (current) drug therapy: Secondary | ICD-10-CM

## 2020-08-29 DIAGNOSIS — I2510 Atherosclerotic heart disease of native coronary artery without angina pectoris: Secondary | ICD-10-CM

## 2020-08-29 NOTE — Progress Notes (Signed)
Virtual Visit via Telephone Note   This visit type was conducted due to national recommendations for restrictions regarding the COVID-19 Pandemic (e.g. social distancing) in an effort to limit this patient's exposure and mitigate transmission in our community.  Due to his co-morbid illnesses, this patient is at least at moderate risk for complications without adequate follow up.  This format is felt to be most appropriate for this patient at this time.  The patient did not have access to video technology/had technical difficulties with video requiring transitioning to audio format only (telephone).  All issues noted in this document were discussed and addressed.  No physical exam could be performed with this format.  Please refer to the patient's chart for his  consent to telehealth for [facility].    Date:  08/31/2020   ID:  Sherlynn Carbon Childress, DOB [birth year], MRN [id number] The patient was identified using 2 identifiers.  Patient Location: Home Provider Location: Office/Clinic  PCP:  Patient, No Pcp Per  Cardiologist:  Olga Millers, MD  Electrophysiologist:  None   Evaluation Performed:  Follow-Up Visit  Chief Complaint:  Follow up  History of Present Illness:    Gary Graham is a 34 y.o. male with hx of hyperlipidemia.  He was admitted for NSTEMI in November 2020.  Urine drug test was positive for opioid.  Initial echocardiogram obtained on 10/26/2019 showed EF 50 to 55%, mild hypokinesis of the anteroseptal and anterior myocardium.  Patient underwent cardiac catheterization on 11/05/2019 which revealed 100% proximal to mid RCA occlusion, this was treated with 3 overlapping Synergy DES, EF was 50 to 55%.  His RCA was a large vessel with some left-to-right collaterals.  Postprocedure, he was placed on aspirin and Brilinta with anticipation to continue DAPT for a minimum of 1 year.  Patient presents today for virtual visit.  He denies any recent chest pain, lower extremity edema,  orthopnea or PND.  He has been compliant with his medication.  Unfortunately he has not repeated any of the lipid panel that was ordered last time.  I recommend a fasting lipid panel and a liver function test in November.  Since his last PCI was performed on 11/05/2019, he may stop the Brilinta starting on 11/05/2020.  I emphasized on the importance of continue the rest of the medication.  He can follow-up with Dr. Jens Som in 6 months.   The patient does not have symptoms concerning for COVID-19 infection (fever, chills, cough, or new shortness of breath).    Past Medical History:  Diagnosis Date  . Dyslipidemia 11/06/2019  . Medical history non-contributory    Past Surgical History:  Procedure Laterality Date  . CORONARY STENT INTERVENTION N/A 11/05/2019   Procedure: CORONARY STENT INTERVENTION;  Surgeon: Swaziland, Peter M, MD;  Location: [facility] INVASIVE CV LAB;  Service: Cardiovascular;  Laterality: N/A;  . LEFT HEART CATH AND CORONARY ANGIOGRAPHY N/A 11/05/2019   Procedure: LEFT HEART CATH AND CORONARY ANGIOGRAPHY;  Surgeon: Swaziland, Peter M, MD;  Location: [facility] INVASIVE CV LAB;  Service: Cardiovascular;  Laterality: N/A;     Current Meds  Medication Sig  . aspirin EC 81 MG EC tablet Take 1 tablet (81 mg total) by mouth daily.  Marland Kitchen atorvastatin (LIPITOR) 80 MG tablet Take 1 tablet (80 mg total) by mouth daily at 6 PM.  . metoprolol succinate (TOPROL-XL) 50 MG 24 hr tablet Take 1 tablet (50 mg total) by mouth daily. Take with or immediately following a meal.  . nitroGLYCERIN (NITROSTAT) 0.4  MG SL tablet Place 1 tablet (0.4 mg total) under the tongue every 5 (five) minutes x 3 doses as needed for chest pain.  . ticagrelor (BRILINTA) 90 MG TABS tablet Take 1 tablet (90 mg total) by mouth 2 (two) times daily.     Allergies:   Patient has no known allergies.   Social History   Tobacco Use  . Smoking status: Former Smoker    Quit date: 11/02/2019    Years since quitting: 0.8  . Smokeless  tobacco: Never Used  Substance Use Topics  . Alcohol use: Yes    Comment: states he drinks most days a week  . Drug use: No     Family Hx: The patient's family history includes Heart attack in his maternal grandfather; Hypertension in his maternal grandmother.  ROS:   Please see the history of present illness.     All other systems reviewed and are negative.   Prior CV studies:   The following studies were reviewed today:  Echo 11/03/2019 1. Left ventricular ejection fraction, by visual estimation, is 50 to  55%. The left ventricle has mildly decreased function. Left ventricular  septal wall thickness was mildly increased. Mildly increased left  ventricular posterior wall thickness. There  is mildly increased left ventricular hypertrophy.  2. Mild hypokinesis of the anteroseptal and anterior myocardium.  3. Global right ventricle has normal systolic function.The right  ventricular size is normal. No increase in right ventricular wall  thickness.  4. Left atrial size was normal.  5. Right atrial size was normal.  6. The mitral valve is normal in structure. No evidence of mitral valve  regurgitation. No evidence of mitral stenosis.  7. The tricuspid valve is normal in structure. Tricuspid valve  regurgitation is not demonstrated.  8. The aortic valve is tricuspid. Aortic valve regurgitation is not  visualized. No evidence of aortic valve sclerosis or stenosis.  9. The pulmonic valve was normal in structure. Pulmonic valve  regurgitation is not visualized.  10. TR signal is inadequate for assessing pulmonary artery systolic  pressure.  11. The inferior vena cava is normal in size with greater than 50%  respiratory variability, suggesting right atrial pressure of 3 mmHg.    Cath 11/05/2019  Prox RCA to Mid RCA lesion is 100% stenosed.  Post intervention, there is a 0% residual stenosis.  A drug-eluting stent was successfully placed using a STENT SYNERGY DES  3X38.  A drug-eluting stent was successfully placed using a STENT SYNERGY DES 3X24.  A drug-eluting stent was successfully placed using a STENT SYNERGY DES 3.5X20.  The left ventricular systolic function is normal.  LV end diastolic pressure is normal.  The left ventricular ejection fraction is 50-55% by visual estimate.   1. Single vessel occlusive CAD. 100% mid RCA. This is a large vessel with some left to right collaterals. 2. Low normal LV function with inferobasal hypokinesis 3. Successful PCI of the RCA with DES x 3 4. Low LVEDP  Plan: DAPT for one year. Anticipate DC in am if stable.    Labs/Other Tests and Data Reviewed:    EKG:  An ECG dated 11/16/2019 was personally reviewed today and demonstrated:  Normal sinus rhythm with T wave inversion in the inferior leads.  Recent Labs: 11/05/2019: ALT 46 11/06/2019: BUN 8; Creatinine, Ser 0.88; Hemoglobin 15.1; Platelets 392; Potassium 4.0; Sodium 141   Recent Lipid Panel Lab Results  Component Value Date/Time   CHOL 144 11/04/2019 02:18 AM   TRIG 111  11/04/2019 02:18 AM   HDL 37 (L) 11/04/2019 02:18 AM   CHOLHDL 3.9 11/04/2019 02:18 AM   LDLCALC 85 11/04/2019 02:18 AM    Wt Readings from Last 3 Encounters:  02/14/20 235 lb 3.2 oz (106.7 kg)  11/16/19 231 lb (104.8 kg)  11/03/19 218 lb (98.9 kg)     Objective:    Vital Signs:  There were no vitals taken for this visit.   VITAL SIGNS:  reviewed  ASSESSMENT & PLAN:    1. CAD: Continue aspirin and Brilinta.  He denies any recent anginal symptom.  He may stop Brilinta on 11/05/2020.  2. Hyperlipidemia: Due for fasting lipid panel and LFT   COVID-19 Education: The signs and symptoms of COVID-19 were discussed with the patient and how to seek care for testing (follow up with PCP or arrange E-visit).  The importance of social distancing was discussed today.  Time:   Today, I have spent 5 minutes with the patient with telehealth technology discussing the above  problems.     Medication Adjustments/Labs and Tests Ordered: Current medicines are reviewed at length with the patient today.  Concerns regarding medicines are outlined above.   Tests Ordered: Orders Placed This Encounter  Procedures  . Lipid panel  . Hepatic function panel    Medication Changes: No orders of the defined types were placed in this encounter.   Follow Up:  In Person in 6 month(s)  Signed, Azalee Course, [place]  08/31/2020 7:58 PM    [facility]

## 2020-08-29 NOTE — Patient Instructions (Addendum)
Medication Instructions:  Ok to stop Brilinta 90 mg November 05, 2020. Please continue all other medication.  *If you need a refill on your cardiac medications before your next appointment, please call your pharmacy*   Lab Work: Your physician recommends that you return for lab work in November (Fasting lipid, LFT).  If you have labs (blood work) drawn today and your tests are completely normal, you will receive your results only by: Marland Kitchen MyChart Message (if you have MyChart) OR . A paper copy in the mail If you have any lab test that is abnormal or we need to change your treatment, we will call you to review the results.   Testing/Procedures: None ordered    Follow-Up: At [facility], you and your health needs are our priority.  As part of our continuing mission to provide you with exceptional heart care, we have created designated Provider Care Teams.  These Care Teams include your primary Cardiologist (physician) and Advanced Practice Providers (APPs -  Physician Assistants and Nurse Practitioners) who all work together to provide you with the care you need, when you need it.  We recommend signing up for the patient portal called "MyChart".  Sign up information is provided on this After Visit Summary.  MyChart is used to connect with patients for Virtual Visits (Telemedicine).  Patients are able to view lab/test results, encounter notes, upcoming appointments, etc.  Non-urgent messages can be sent to your provider as well.   To learn more about what you can do with MyChart, go to [email or web address].    Your next appointment:   6 month(s)  The format for your next appointment:   In Person  Provider:   Olga Millers, MD

## 2020-10-14 MED FILL — $BRILINTA 90 MG TABLET: 90 | 30 days supply | Qty: 60 | Fill #1

## 2020-10-14 MED FILL — ATORVASTATIN CALCIUM 80 MG: 80 | 30 days supply | Qty: 30 | Fill #1

## 2020-10-14 MED FILL — METOPROLOL SUCCINATE ER 50: 50 | 30 days supply | Qty: 30 | Fill #1

## 2020-10-14 MED FILL — NITROGLYCERIN 0.4 MG TAB SL: 0.4 | 25 days supply | Qty: 25 | Fill #1

## 2021-03-19 ENCOUNTER — Encounter: Payer: Self-pay | Admitting: Cardiology

## 2021-03-19 NOTE — Telephone Encounter (Signed)
error 

## 2021-05-29 IMAGING — CR DG CHEST 2V
2 series · 2 of 2 positions shown · non-contrast
Comparison: Radiograph 06/19/2016

CLINICAL DATA: Chest pain

EXAM:
CHEST - 2 VIEW

[chest pa]
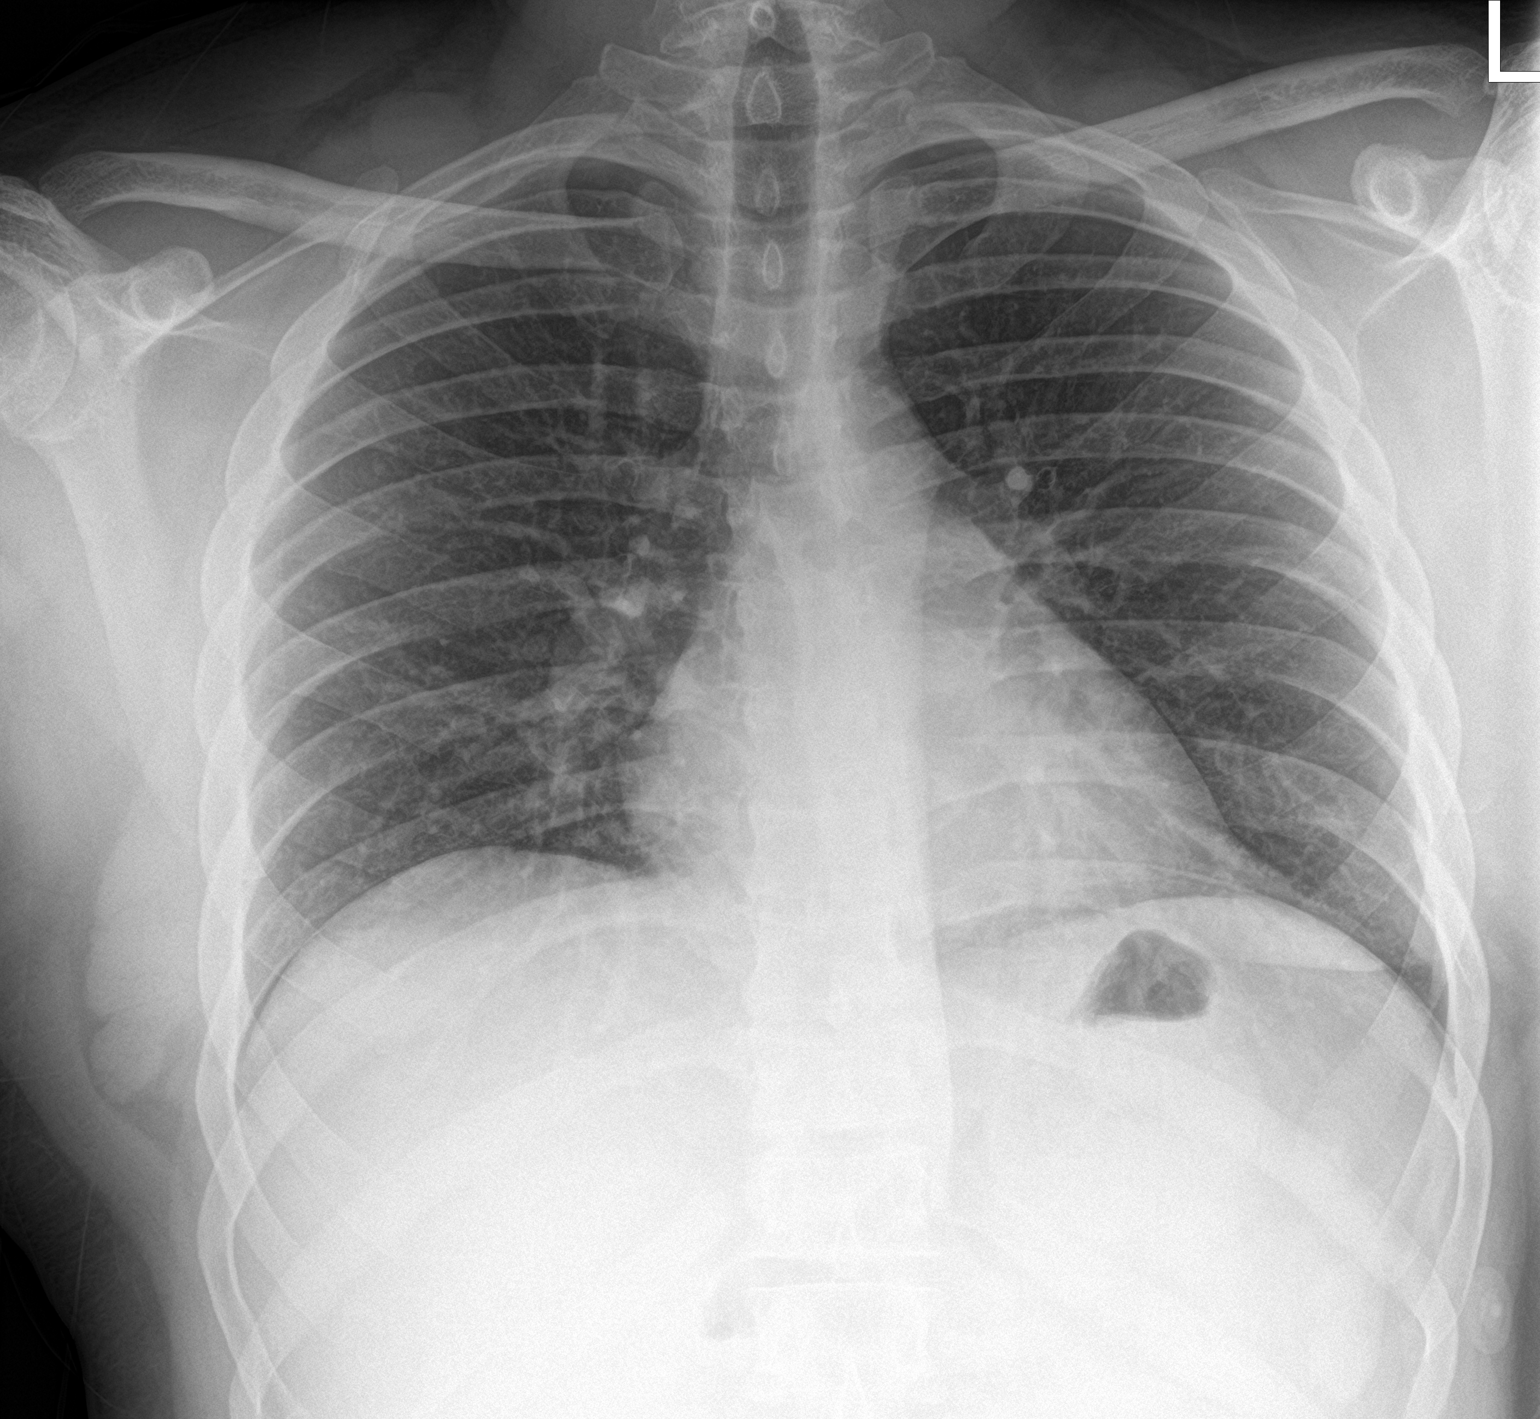

[chest lat]
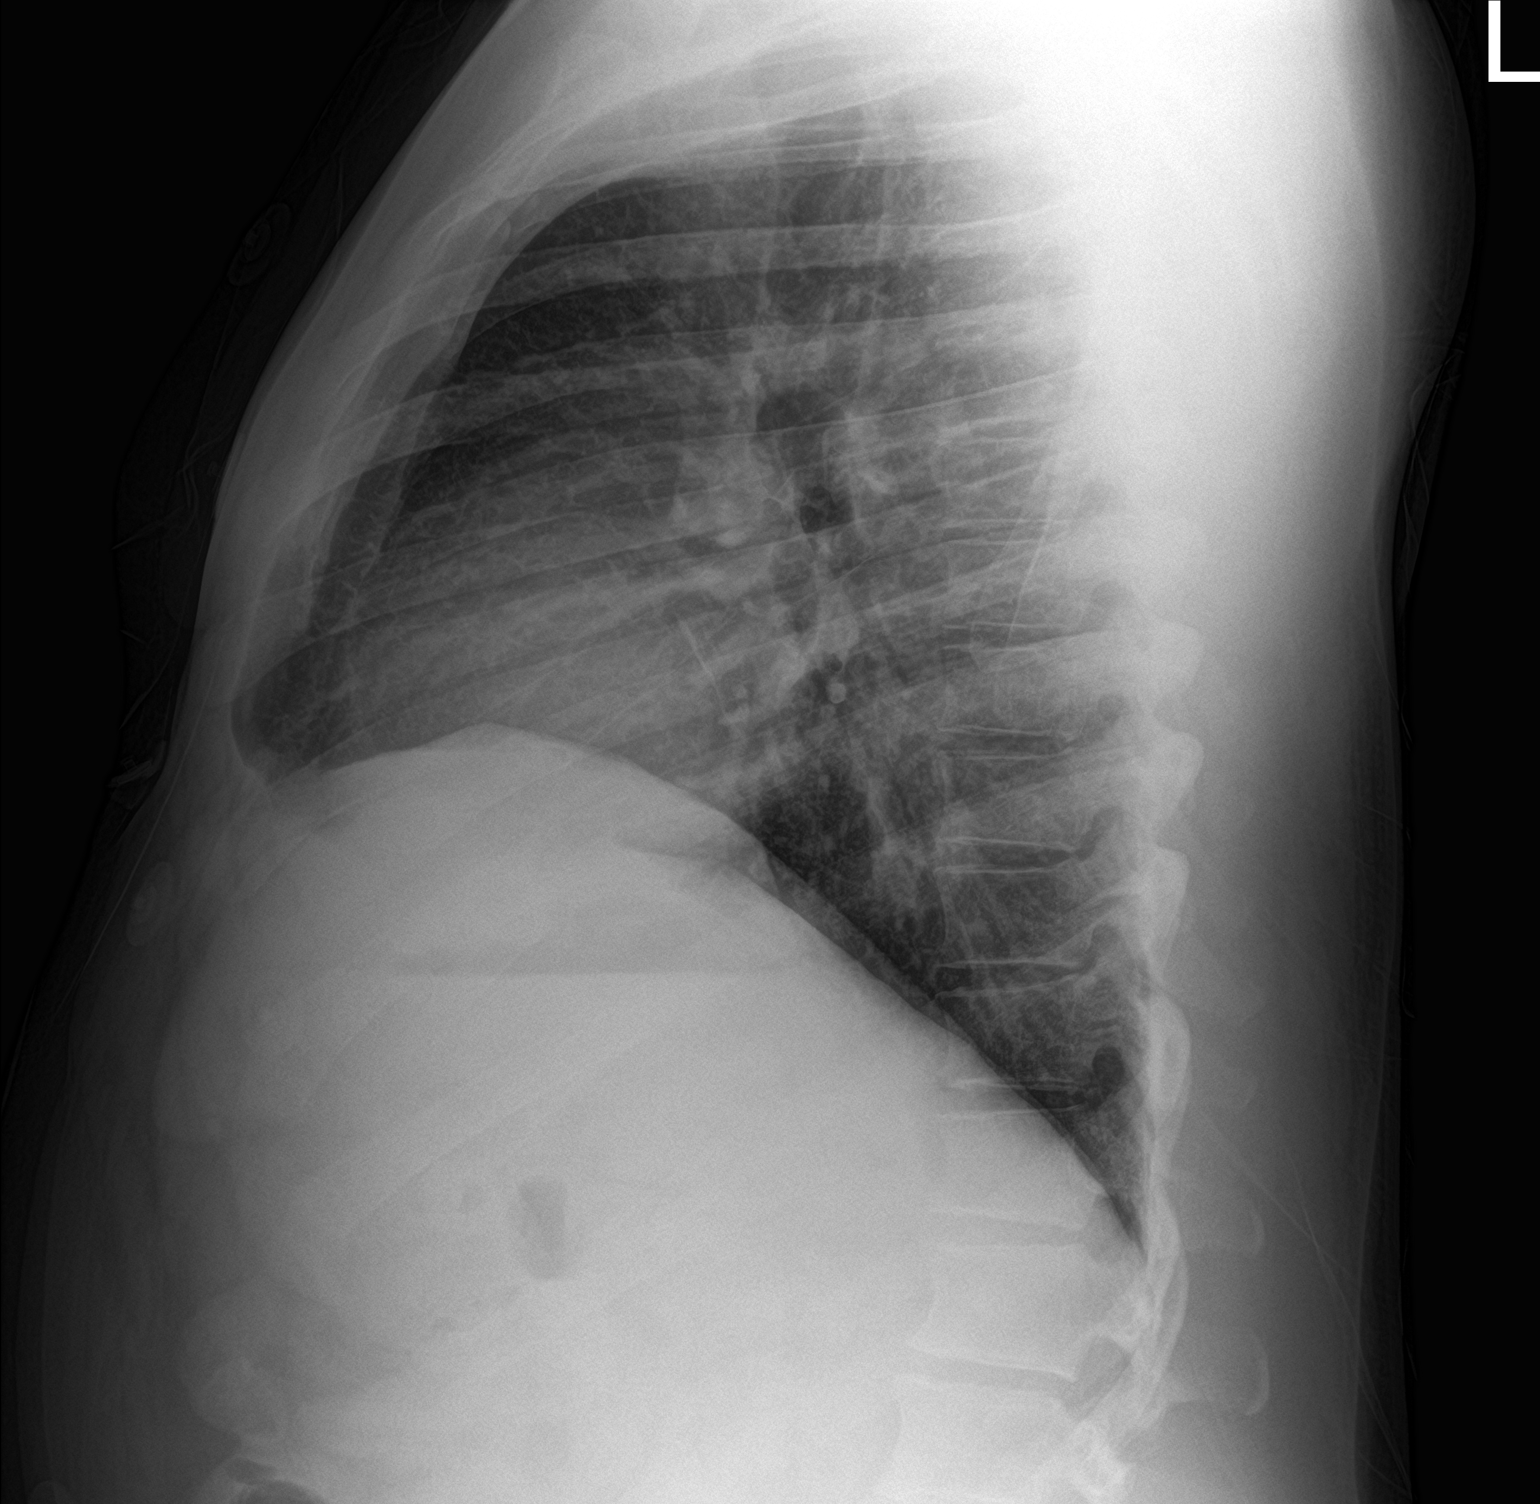

[2 of 2 positions shown; findings below may reference images not displayed]

FINDINGS: No consolidation, features of edema, pneumothorax, or effusion.
Pulmonary vascularity is normally distributed. The cardiomediastinal
contours are unremarkable. No acute osseous or soft tissue
abnormality.
IMPRESSION: No acute cardiopulmonary abnormality.

## 2021-07-28 ENCOUNTER — Telehealth: Payer: Self-pay | Admitting: Cardiology

## 2021-07-28 NOTE — Telephone Encounter (Signed)
Left message for patient's mother to call back. (She is not listed on DPR).
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
## 2021-08-12 ENCOUNTER — Other Ambulatory Visit: Payer: Self-pay

## 2021-08-12 NOTE — Telephone Encounter (Signed)
Three attempts were made to contact with no call back.  Will remove from triage.
